# Patient Record
Sex: Female | Born: 1965
Health system: Southern US, Community
[De-identification: ages and names within clinical notes are randomized; demographics above are authoritative.]

## PROBLEM LIST (undated history)

## (undated) DIAGNOSIS — R51 Headache: Secondary | ICD-10-CM

## (undated) HISTORY — DX: Headache: R51

---

## 1998-08-23 ENCOUNTER — Other Ambulatory Visit: Admission: RE | Admit: 1998-08-23 | Discharge: 1998-08-23 | Payer: Self-pay | Admitting: Obstetrics and Gynecology

## 1998-11-23 ENCOUNTER — Inpatient Hospital Stay (HOSPITAL_COMMUNITY): Admission: AD | Admit: 1998-11-23 | Discharge: 1998-11-23 | Payer: Self-pay | Admitting: Obstetrics and Gynecology

## 1999-03-23 ENCOUNTER — Inpatient Hospital Stay (HOSPITAL_COMMUNITY): Admission: AD | Admit: 1999-03-23 | Discharge: 1999-03-25 | Payer: Self-pay | Admitting: Obstetrics and Gynecology

## 1999-05-02 ENCOUNTER — Other Ambulatory Visit: Admission: RE | Admit: 1999-05-02 | Discharge: 1999-05-02 | Payer: Self-pay | Admitting: Obstetrics and Gynecology

## 2000-05-09 ENCOUNTER — Other Ambulatory Visit: Admission: RE | Admit: 2000-05-09 | Discharge: 2000-05-09 | Payer: Self-pay | Admitting: Obstetrics and Gynecology

## 2001-12-26 ENCOUNTER — Other Ambulatory Visit: Admission: RE | Admit: 2001-12-26 | Discharge: 2001-12-26 | Payer: Self-pay | Admitting: Obstetrics and Gynecology

## 2005-05-03 ENCOUNTER — Other Ambulatory Visit: Admission: RE | Admit: 2005-05-03 | Discharge: 2005-05-03 | Payer: Self-pay | Admitting: Obstetrics and Gynecology

## 2011-05-25 ENCOUNTER — Other Ambulatory Visit: Payer: Self-pay | Admitting: Obstetrics and Gynecology

## 2011-05-25 DIAGNOSIS — R928 Other abnormal and inconclusive findings on diagnostic imaging of breast: Secondary | ICD-10-CM

## 2011-06-09 ENCOUNTER — Ambulatory Visit
Admission: RE | Admit: 2011-06-09 | Discharge: 2011-06-09 | Disposition: A | Discharge status: Home / Self Care | Payer: 59 | Source: Ambulatory Visit | Attending: Obstetrics and Gynecology | Admitting: Obstetrics and Gynecology

## 2011-06-09 DIAGNOSIS — R928 Other abnormal and inconclusive findings on diagnostic imaging of breast: Secondary | ICD-10-CM

## 2011-07-24 ENCOUNTER — Encounter: Payer: Self-pay | Admitting: Family Medicine

## 2011-07-24 ENCOUNTER — Ambulatory Visit (INDEPENDENT_AMBULATORY_CARE_PROVIDER_SITE_OTHER): Payer: 59 | Admitting: Family Medicine

## 2011-07-24 VITALS — BP 124/84 | Temp 98.7°F | Ht 68.0 in | Wt 234.0 lb

## 2011-07-24 DIAGNOSIS — Z0000 Encounter for general adult medical examination without abnormal findings: Secondary | ICD-10-CM

## 2011-07-24 NOTE — Patient Instructions (Signed)
When you do your breast exam monthly at home also do a thorough skin exam.  We will see u  yearly for general physical examination and as a part of that.  We will also do a complete skin exam.  I will put in a consult request to see the nutritionist about her diet and weight loss program

## 2011-07-24 NOTE — Progress Notes (Signed)
  Subjective:    Patient ID: Laura Ward, female    DOB: Feb 23, 1966, 45 y.o.   MRN: [id number]  HPI Ramaya is a 45 year old, married female, G84, P31, 6 year old daughter 73 year old son, who comes in today and is a new patient for general physical examination  Sheets always been in excellent, health.  She's had no chronic health problems.  Her children are 15 and 12.  For birth control use condoms.  Pap by GYN.  Normal.She does BSE monthly.  Recent mammogram was normal except for a suspicious lesion left breast.  Follow-up ultrasound normal.  Mother had breast cancer at age 70.  She gets routine eye care, now worse reading glasses, regular dental care,  She had a dysplastic nevus removed from her back 2008.  The rest review of systems negative.  She is up-to-date on all her vaccinations.  Father has a history of high cholesterol.  Mother had breast cancer.  States 32, now atrial fib, but a breast cancer survivor.  One brother two sisters in good health.  Social, history she's married lives in [place].  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  Review of Systems  Constitutional: Negative.   HENT: Negative.   Eyes: Negative.   Respiratory: Negative.   Cardiovascular: Negative.   Gastrointestinal: Negative.   Genitourinary: Negative.   Musculoskeletal: Negative.   Neurological: Negative.   Hematological: Negative.   Psychiatric/Behavioral: Negative.        Objective:   Physical Exam  Constitutional: She appears well-developed and well-nourished.  HENT:  Head: Normocephalic and atraumatic.  Right Ear: External ear normal.  Left Ear: External ear normal.  Nose: Nose normal.  Mouth/Throat: Oropharynx is clear and moist.  Eyes: EOM are normal. Pupils are equal, round, and reactive to light.  Neck: Normal range of motion. Neck supple. No thyromegaly present.  Cardiovascular: Normal rate, regular rhythm, normal heart sounds and intact distal pulses.  Exam  reveals no gallop and no friction rub.   No murmur heard. Pulmonary/Chest: Effort normal and breath sounds normal.  Abdominal: Soft. Bowel sounds are normal. She exhibits no distension and no mass. There is no tenderness. There is no rebound.  Genitourinary:       Bilateral breast exam normal  Musculoskeletal: Normal range of motion.  Lymphadenopathy:    She has no cervical adenopathy.  Neurological: She is alert. She has normal reflexes. No cranial nerve deficit. She exhibits normal muscle tone. Coordination normal.  Skin: Skin is warm and dry.  Psychiatric: She has a normal mood and affect. Her behavior is normal. Judgment and thought content normal.   Total body skin exam shows numerous freckles moles skin tags.  Nothing appears to be pathologic.  There is an inch Scar on her left scapula from previous dysplastic nevi removal       Assessment & Plan:  Healthy female.  Weight 234 pounds.  Discussed diet, exercise and weight loss.  History of dysplastic nevus.  Careful skin exam monthly at home also.  Careful exam here, yearly

## 2011-08-02 ENCOUNTER — Ambulatory Visit: Payer: 59 | Admitting: *Deleted

## 2011-08-18 ENCOUNTER — Ambulatory Visit: Payer: 59 | Admitting: Dietician

## 2012-09-13 ENCOUNTER — Other Ambulatory Visit: Payer: Self-pay | Admitting: Obstetrics and Gynecology

## 2012-09-13 DIAGNOSIS — Z1231 Encounter for screening mammogram for malignant neoplasm of breast: Secondary | ICD-10-CM

## 2012-10-09 ENCOUNTER — Ambulatory Visit
Admission: RE | Admit: 2012-10-09 | Discharge: 2012-10-09 | Disposition: A | Discharge status: Home / Self Care | Payer: 59 | Source: Ambulatory Visit | Attending: Obstetrics and Gynecology | Admitting: Obstetrics and Gynecology

## 2012-10-09 DIAGNOSIS — Z1231 Encounter for screening mammogram for malignant neoplasm of breast: Secondary | ICD-10-CM

## 2012-10-22 ENCOUNTER — Other Ambulatory Visit: Payer: 59

## 2012-10-25 ENCOUNTER — Other Ambulatory Visit (INDEPENDENT_AMBULATORY_CARE_PROVIDER_SITE_OTHER): Payer: Federal, State, Local not specified - PPO

## 2012-10-25 DIAGNOSIS — Z0000 Encounter for general adult medical examination without abnormal findings: Secondary | ICD-10-CM

## 2012-10-25 LAB — POCT URINALYSIS DIPSTICK
Bilirubin, UA: NEGATIVE
Blood, UA: NEGATIVE
Nitrite, UA: NEGATIVE
Spec Grav, UA: 1.01
Urobilinogen, UA: 0.2
pH, UA: 7

## 2012-10-25 LAB — CBC WITH DIFFERENTIAL/PLATELET
Basophils Relative: 0.8 % (ref 0.0–3.0)
Eosinophils Relative: 2.4 % (ref 0.0–5.0)
Hemoglobin: 12.9 g/dL (ref 12.0–15.0)
Lymphocytes Relative: 32 % (ref 12.0–46.0)
Neutro Abs: 1.7 10*3/uL (ref 1.4–7.7)
Neutrophils Relative %: 53.3 % (ref 43.0–77.0)
RBC: 4.37 Mil/uL (ref 3.87–5.11)
WBC: 3.2 10*3/uL — ABNORMAL LOW (ref 4.5–10.5)

## 2012-10-25 LAB — HEPATIC FUNCTION PANEL
ALT: 18 U/L (ref 0–35)
AST: 20 U/L (ref 0–37)
Bilirubin, Direct: 0 mg/dL (ref 0.0–0.3)
Total Protein: 6.8 g/dL (ref 6.0–8.3)

## 2012-10-25 LAB — LIPID PANEL
Cholesterol: 195 mg/dL (ref 0–200)
HDL: 45.4 mg/dL (ref >39.00)
LDL Cholesterol: 139 mg/dL — ABNORMAL HIGH (ref 0–99)
Total CHOL/HDL Ratio: 4
Triglycerides: 51 mg/dL (ref 0.0–149.0)

## 2012-10-25 LAB — BASIC METABOLIC PANEL
CO2: 25 mEq/L (ref 19–32)
Chloride: 105 mEq/L (ref 96–112)
Sodium: 135 mEq/L (ref 135–145)

## 2012-10-29 ENCOUNTER — Encounter: Payer: Self-pay | Admitting: Family Medicine

## 2012-10-29 ENCOUNTER — Ambulatory Visit (INDEPENDENT_AMBULATORY_CARE_PROVIDER_SITE_OTHER): Payer: Federal, State, Local not specified - PPO | Admitting: Family Medicine

## 2012-10-29 VITALS — BP 110/80 | Temp 98.3°F | Ht 67.75 in | Wt 226.0 lb

## 2012-10-29 DIAGNOSIS — Z0000 Encounter for general adult medical examination without abnormal findings: Secondary | ICD-10-CM

## 2012-10-29 DIAGNOSIS — Z803 Family history of malignant neoplasm of breast: Secondary | ICD-10-CM

## 2012-10-29 NOTE — Patient Instructions (Signed)
Continue your good health habits  Begin a diet and exercise program  We will set you up a time to see Maylon Cos for genetic discussion  Do a thorough breast exam monthly at home  Return in one year sooner if any problems  Set up a time to remove the lesion on the left lower leg ASAP

## 2012-10-29 NOTE — Progress Notes (Signed)
  Subjective:    Patient ID: Laura Ward, female    DOB: 08/06/1965, 47 y.o.   MRN: [id number]  HPI Laura Ward is a delightful 47 year old female nonsmoker who comes in today for general medical examination  She states she feels well and has no complaints. Her periods are regular she sees her GYN for yearly Paps  She had a dysplastic lesion removed from her left subscapular reason years ago. We will also do a complete skin exam because it was a dysplastic nevus  She had a recent mammogram which was normal. In reviewing her family history her mother was diagnosed of breast cancer at age 50 maternal grandmother pancreatic cancer maternal grandfather colon cancer. I would recommend she have some genetic evaluation since her mom had breast cancer at such an early age. I will refer her to Maylon Cos geneticist  She gets routine eye care, dental care, does not check her breasts monthly but does get and you mammography.  Laura Ward is a G2 P2,,,,,,,,, 66 year old daughter and 21 year old son   Review of Systems  Constitutional: Negative.   HENT: Negative.   Eyes: Negative.   Respiratory: Negative.   Cardiovascular: Negative.   Gastrointestinal: Negative.   Genitourinary: Negative.   Musculoskeletal: Negative.   Neurological: Negative.   Psychiatric/Behavioral: Negative.        Objective:   Physical Exam  Constitutional: She is oriented to person, place, and time. She appears well-developed and well-nourished.  HENT:  Head: Normocephalic and atraumatic.  Right Ear: External ear normal.  Left Ear: External ear normal.  Nose: Nose normal.  Mouth/Throat: Oropharynx is clear and moist.  Eyes: EOM are normal. Pupils are equal, round, and reactive to light.  Neck: Normal range of motion. Neck supple. No thyromegaly present.  Cardiovascular: Normal rate, regular rhythm, normal heart sounds and intact distal pulses.  Exam reveals no gallop and no friction rub.   No murmur  heard. Pulmonary/Chest: Effort normal and breath sounds normal.  Abdominal: Soft. Bowel sounds are normal. She exhibits no distension and no mass. There is no tenderness. There is no rebound.  Genitourinary:  Bilateral breast exam normal she was taught BSE  Musculoskeletal: Normal range of motion.  Lymphadenopathy:    She has no cervical adenopathy.  Neurological: She is alert and oriented to person, place, and time. She has normal reflexes. No cranial nerve deficit. She exhibits normal muscle tone. Coordination normal.  Skin: Skin is warm and dry.  Total body skin exam normal except for scar left subscapular area from previous dysplastic nevus that was removed.  She does have a lesion left lower calf is red and irritated and bleeds a lot when she shaves. Advised to return for removal  Psychiatric: She has a normal mood and affect. Her behavior is normal. Judgment and thought content normal.          Assessment & Plan:  Healthy female  Positive family history of breast cancer,,,,,,,,,, mother diagnosed at age 47,,,,,,,, I will set up to see Maylon Cos  for genetic evaluation  Slightly overweight encouraged diet exercise and weight loss

## 2012-10-30 ENCOUNTER — Telehealth: Payer: Self-pay | Admitting: Genetic Counselor

## 2012-10-30 NOTE — Telephone Encounter (Signed)
S/W PT IN RE TO GENETIC APPT 04/30 @ 1:30 W/CATHERINE FINE WELCOME PACKET EMAILED.

## 2012-11-08 ENCOUNTER — Other Ambulatory Visit: Payer: Federal, State, Local not specified - PPO | Admitting: Lab

## 2012-11-12 ENCOUNTER — Ambulatory Visit: Payer: Federal, State, Local not specified - PPO | Admitting: Family Medicine

## 2012-11-20 ENCOUNTER — Other Ambulatory Visit: Payer: Federal, State, Local not specified - PPO | Admitting: Lab

## 2012-11-20 ENCOUNTER — Ambulatory Visit (HOSPITAL_BASED_OUTPATIENT_CLINIC_OR_DEPARTMENT_OTHER): Payer: Federal, State, Local not specified - PPO | Admitting: Genetic Counselor

## 2012-11-20 DIAGNOSIS — Z803 Family history of malignant neoplasm of breast: Secondary | ICD-10-CM

## 2012-11-20 DIAGNOSIS — IMO0002 Reserved for concepts with insufficient information to code with codable children: Secondary | ICD-10-CM

## 2012-11-20 DIAGNOSIS — Z800 Family history of malignant neoplasm of digestive organs: Secondary | ICD-10-CM

## 2012-11-20 NOTE — Progress Notes (Signed)
Dr. Tawanna Cooler requested a cancer genetics consultation for Pennye Beeghly, a 47 y.o. female, due to a family history of cancer.  Ms. Biondo presents to clinic today to discuss the possibility of a hereditary predisposition to cancer, genetic testing, and to further clarify her future cancer risks, as well as potential cancer risk for family members.   HISTORY OF PRESENT ILLNESS: Ms. Cuthrell has no personal history of cancer.   Past Medical History  Diagnosis Date   Headache     No past surgical history on file.  HORMONAL RISK FACTORS: Menarche was at age 53 First live birth at age 47  OCP use: approximately 7 years Ovaries intact: yes Hysterectomy: no Menopausal status: premenopausal HRT use: approximately 0 years Colonoscopy: no; not examined UTD mammogram: yes Number of breast biopsies: 0 UTD pelvic exam:  yes Excessive radiation exposure:  no  History   Social History   Marital Status: Single    Spouse Name: N/A    Number of Children: N/A   Years of Education: N/A   Social History Main Topics   Smoking status: Never Smoker    Smokeless tobacco: Not on file   Alcohol Use: No   Drug Use: No   Sexually Active: Not on file   Other Topics Concern   Not on file   Social History Narrative   No narrative on file     FAMILY HISTORY:  During the visit, a 4-generation pedigree was obtained. Significant diagnoses include the following:  Family History  Problem Relation Age of Onset   Breast cancer Mother 67    TAH-BSO in 86s - benign reasons   Pancreatic cancer Maternal Grandmother 73   Colon cancer Maternal Grandfather 36    Ms. Sebastiano's ancestry is of Caucasian descent. There is no known consanguinity.  GENETIC COUNSELING ASSESSMENT: Ms. Kellenberger is a 47 y.o. female with a family history of breast cancer, colon cancer and pancreatic cancer. This history is suggestive of a hereditary predisposition to cancer and we, therefore, discussed and recommended the  following at today's visit.   DISCUSSION: We reviewed the characteristics, features and inheritance patterns of hereditary cancer syndromes. We also discussed genetic testing, including the appropriate family members to test, the process of testing, insurance coverage and turn-around-time for results. Based on Ms. Knapps family history, we recommended her mother, who was diagnosed with breast cancer at age 7, have genetic counseling and testing. We discussed that it is always most informative to initiate genetic testing in a family member diagnosed with cancer, but if this is not possible, we would then test an unaffected family member. Ms. Tally will speak with this family member and let us know if we can be of any assistance in coordinating genetic counseling and/or testing. However, in the meantime, she did wish to pursue genetic testing for herself.   PLAN: Ms. Ryland wished to pursue genetic testing and the blood sample will be sent to [facility] for analysis of the breast and ovarian cancer gene panel.  We discussed the implications of a positive, negative and/ or variant of uncertain significance genetic test result. Results should be available within approximately 8 weeks time, at which point they will be disclosed by telephone to Ms. Schaffer, as will any additional recommendations warranted by these results. Ms. Manalang will receive a summary of her genetic counseling visit and a copy of her results once available. This information will also be available in Epic.     We also encouraged Ms. Lynelle Doctor  to remain in contact with cancer genetics annually so that we can continuously update the family history and inform her of any changes in cancer genetics and testing that may be of benefit for this family. Ms.  Rovira questions were answered to her satisfaction today. Our contact information was provided should additional questions or concerns arise. Thank you for the referral and allowing [place] to share  in the care of your patient.   The patient was seen for a total of 55 minutes, greater than 50% of which was spent face-to-face counseling.  This patient was discussed with Dr. Drue Second and she agrees with the above.   _______________________________________________________________________ For Office Staff:  Number of people involved in session: 2 Was an Intern/ student involved with case: not applicable

## 2012-12-23 ENCOUNTER — Encounter: Payer: Self-pay | Admitting: Genetic Counselor

## 2014-09-17 ENCOUNTER — Other Ambulatory Visit (INDEPENDENT_AMBULATORY_CARE_PROVIDER_SITE_OTHER): Payer: Federal, State, Local not specified - PPO

## 2014-09-17 DIAGNOSIS — Z0000 Encounter for general adult medical examination without abnormal findings: Secondary | ICD-10-CM

## 2014-09-17 LAB — BASIC METABOLIC PANEL
BUN: 12 mg/dL (ref 6–23)
CHLORIDE: 106 meq/L (ref 96–112)
CO2: 26 mEq/L (ref 19–32)
CREATININE: 0.92 mg/dL (ref 0.40–1.20)
Calcium: 8.9 mg/dL (ref 8.4–10.5)
GFR: 69.36 mL/min (ref >60.00)
GLUCOSE: 96 mg/dL (ref 70–99)
Potassium: 4.3 mEq/L (ref 3.5–5.1)
Sodium: 137 mEq/L (ref 135–145)

## 2014-09-17 LAB — LIPID PANEL
Cholesterol: 218 mg/dL — ABNORMAL HIGH (ref 0–200)
HDL: 54.2 mg/dL (ref >39.00)
LDL Cholesterol: 152 mg/dL — ABNORMAL HIGH (ref 0–99)
NonHDL: 163.8
Total CHOL/HDL Ratio: 4
Triglycerides: 60 mg/dL (ref 0.0–149.0)
VLDL: 12 mg/dL (ref 0.0–40.0)

## 2014-09-17 LAB — CBC WITH DIFFERENTIAL/PLATELET
BASOS ABS: 0 10*3/uL (ref 0.0–0.1)
Basophils Relative: 0.9 % (ref 0.0–3.0)
Eosinophils Absolute: 0.1 10*3/uL (ref 0.0–0.7)
Eosinophils Relative: 3.1 % (ref 0.0–5.0)
HEMATOCRIT: 38.4 % (ref 36.0–46.0)
Hemoglobin: 13.3 g/dL (ref 12.0–15.0)
LYMPHS ABS: 0.9 10*3/uL (ref 0.7–4.0)
Lymphocytes Relative: 28.5 % (ref 12.0–46.0)
MCHC: 34.6 g/dL (ref 30.0–36.0)
MCV: 86.5 fl (ref 78.0–100.0)
MONOS PCT: 11.5 % (ref 3.0–12.0)
Monocytes Absolute: 0.4 10*3/uL (ref 0.1–1.0)
NEUTROS ABS: 1.8 10*3/uL (ref 1.4–7.7)
Neutrophils Relative %: 56 % (ref 43.0–77.0)
Platelets: 245 10*3/uL (ref 150.0–400.0)
RBC: 4.44 Mil/uL (ref 3.87–5.11)
RDW: 13 % (ref 11.5–15.5)
WBC: 3.2 10*3/uL — AB (ref 4.0–10.5)

## 2014-09-17 LAB — POCT URINALYSIS DIPSTICK
Bilirubin, UA: NEGATIVE
Glucose, UA: NEGATIVE
Ketones, UA: NEGATIVE
Leukocytes, UA: NEGATIVE
Nitrite, UA: NEGATIVE
PROTEIN UA: NEGATIVE
RBC UA: NEGATIVE
SPEC GRAV UA: 1.015
UROBILINOGEN UA: 0.2
pH, UA: 7

## 2014-09-17 LAB — HEPATIC FUNCTION PANEL
ALT: 14 U/L (ref 0–35)
AST: 16 U/L (ref 0–37)
Albumin: 4 g/dL (ref 3.5–5.2)
Alkaline Phosphatase: 63 U/L (ref 39–117)
BILIRUBIN DIRECT: 0.1 mg/dL (ref 0.0–0.3)
TOTAL PROTEIN: 6.6 g/dL (ref 6.0–8.3)
Total Bilirubin: 0.6 mg/dL (ref 0.2–1.2)

## 2014-09-17 LAB — TSH: TSH: 1.48 u[IU]/mL (ref 0.35–4.50)

## 2014-09-22 ENCOUNTER — Ambulatory Visit (INDEPENDENT_AMBULATORY_CARE_PROVIDER_SITE_OTHER): Payer: Federal, State, Local not specified - PPO | Admitting: Family Medicine

## 2014-09-22 ENCOUNTER — Encounter: Payer: Self-pay | Admitting: Family Medicine

## 2014-09-22 VITALS — BP 120/80 | Temp 98.4°F | Ht 69.0 in | Wt 229.0 lb

## 2014-09-22 DIAGNOSIS — Z0000 Encounter for general adult medical examination without abnormal findings: Secondary | ICD-10-CM

## 2014-09-22 NOTE — Progress Notes (Signed)
   Subjective:    Patient ID: Laura Ward, female    DOB: 10-29-1965, 49 y.o.   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  Her medical issue is been her weight. When she was here last year she dropped about 15-20 pounds and then she gained it back. She's up to 229 at 69 inches tall EP normal 120/80  Does not get routine eye care recommended Dr. Hazle Quantigby, does get regular dental care. Colonoscopy at age 49, she does breast exams sporadically last mammogram was March 2014. Recommend annual mammography and monthly BSE  Family history mother had breast cancer dates 2333. We send Laura Ward to the geneticist last year. Genetic workup showed she was negative for increased risk for breast cancer. Father's retired pediatrician his high cholesterol to sister's one brother all older but all 3 in good health.  Vaccinations up-to-date at the hospital   Review of Systems  Constitutional: Negative.   HENT: Negative.   Eyes: Negative.   Respiratory: Negative.   Cardiovascular: Negative.   Gastrointestinal: Negative.   Endocrine: Negative.   Genitourinary: Negative.   Musculoskeletal: Negative.   Skin: Negative.   Allergic/Immunologic: Negative.   Neurological: Negative.   Hematological: Negative.   Psychiatric/Behavioral: Negative.        Objective:   Physical Exam  Constitutional: She appears well-developed and well-nourished.  HENT:  Head: Normocephalic and atraumatic.  Right Ear: External ear normal.  Left Ear: External ear normal.  Nose: Nose normal.  Mouth/Throat: Oropharynx is clear and moist.  Eyes: EOM are normal. Pupils are equal, round, and reactive to light.  Neck: Normal range of motion. Neck supple. No JVD present. No tracheal  deviation present. No thyromegaly present.  Cardiovascular: Normal rate, regular rhythm, normal heart sounds and intact distal pulses.  Exam reveals no gallop and no friction rub.   No murmur heard. Pulmonary/Chest: Effort normal and breath sounds normal. No stridor. No respiratory distress. She has no wheezes. She has no rales. She exhibits no tenderness.  Abdominal: Soft. Bowel sounds are normal. She exhibits no distension and no mass. There is no tenderness. There is no rebound and no guarding.  Genitourinary:  Bilateral breast exam normal  Musculoskeletal: Normal range of motion.  Lymphadenopathy:    She has no cervical adenopathy.  Neurological: She is alert. She has normal reflexes. No cranial nerve deficit. She exhibits normal muscle tone. Coordination normal.  Skin: Skin is warm and dry. No rash noted. No erythema. No pallor.  Total body skin exam normal scar left subscapular area from previous lesion removed also right upper posterior thoracic area. Both lesions were dysplastic no skin cancers. The rest of her skin Exam was normal  Psychiatric: She has a normal mood and affect. Her behavior is normal. Judgment and thought content normal.  Nursing note and vitals reviewed.         Assessment & Plan:  Healthy female  Overweight,,,,,,,, again encouraged diet exercise and weight loss

## 2014-09-22 NOTE — Progress Notes (Signed)
Pre visit review using our clinic review tool, if applicable. No additional management support is needed unless otherwise documented below in the visit note. 

## 2014-09-22 NOTE — Patient Instructions (Signed)
   Resume your diet and exercise program.........Marland Kitchen. may be start in the [place] on your trip  Return in one year for general physical exam sooner if any problems  BSE monthly  Set up your screening mammogram........ annual in the future  Rachel's extension is 2231...... please give us a dates of all your vaccinations

## 2015-06-10 ENCOUNTER — Ambulatory Visit (INDEPENDENT_AMBULATORY_CARE_PROVIDER_SITE_OTHER): Payer: Federal, State, Local not specified - PPO | Admitting: Family Medicine

## 2015-06-10 ENCOUNTER — Encounter: Payer: Self-pay | Admitting: Family Medicine

## 2015-06-10 VITALS — BP 118/82 | HR 63 | Temp 98.4°F | Ht 69.0 in | Wt 227.3 lb

## 2015-06-10 DIAGNOSIS — L723 Sebaceous cyst: Secondary | ICD-10-CM | POA: Diagnosis not present

## 2015-06-10 MED ORDER — DOXYCYCLINE HYCLATE 100 MG PO TABS: 100.0000 mg | ORAL_TABLET | Freq: Two times a day (BID) | ORAL | Status: DC

## 2015-06-10 NOTE — Progress Notes (Signed)
HPI:  Acute visit for:  "cyst" -has a cyst on chest -has become inflamed several times -has been red and sore the last few days -denies: fevers, malaise, drainage -she reports PCP has offered removal, she would like to remove to prevent recurrence  ROS: See pertinent positives and negatives per HPI.  Past Medical History  Diagnosis Date  . Headache(784.0)     No past surgical history on file.  Family History  Problem Relation Age of Onset  . Breast cancer Mother 71    TAH-BSO in 62s - benign reasons  . Pancreatic cancer Maternal Grandmother 14  . Colon cancer Maternal Grandfather 52    Social History   Social History  . Marital Status: Single    Spouse Name: N/A  . Number of Children: N/A  . Years of Education: N/A   Social History Main Topics  . Smoking status: Never Smoker   . Smokeless tobacco: None  . Alcohol Use: No  . Drug Use: No  . Sexual Activity: Not Asked   Other Topics Concern  . None   Social History Narrative     Current outpatient prescriptions:  .  doxycycline (VIBRA-TABS) 100 MG tablet, Take 1 tablet (100 mg total) by mouth 2 (two) times daily., Disp: 20 tablet, Rfl: 0  EXAM:  Filed Vitals:   06/10/15 0936  BP: 118/82  Pulse: 63  Temp: 98.4 F (36.9 C)    Body mass index is 33.55 kg/(m^2).  GENERAL: vitals reviewed and listed above, alert, oriented, appears well hydrated and in no acute distress  HEENT: atraumatic, conjunttiva clear, no obvious abnormalities on inspection of external nose and ears  NECK: no obvious masses on inspection  SKIN: small inflamed epidermoid cyst L chest with minimal surrounding erythema of the skin  MS: moves all extremities without noticeable abnormality  PSYCH: pleasant and cooperative, no obvious depression or anxiety  ASSESSMENT AND PLAN:  Discussed the following assessment and plan:  Inflamed epidermoid cyst of skin  -inflamed cyst most likely, infected cyst possible but less  likely -discussed tx options and options for removal - removal to prevent recurrence is best done when cyst is not inflamed -discussed i and d, she prefers to avoid if possible -opted for compresses, short course doxy for antiinflammatory effects and in case infected -Patient advised to return or notify a doctor immediately if symptoms worsen or persist or new concerns arise.  Patient Instructions  Epidermal Cyst An epidermal cyst is usually a small, painless lump under the skin. Cysts often occur on the face, neck, stomach, chest, or genitals. The cyst may be filled with a bad smelling paste. Do not pop your cyst. Popping the cyst can cause pain and puffiness (swelling). HOME CARE   Only take medicines as told by your doctor.  Warm compresses several times daily  Take your medicine (antibiotics) as told. Finish it even if you start to feel better. GET HELP RIGHT AWAY IF:  Your cyst is more tender, red, or puffy.  You are not getting better, or you are getting worse.  You have any questions or concerns. MAKE SURE YOU:  Understand these instructions.  Will watch your condition.  Will get help right away if you are not doing well or get worse.   This information is not intended to replace advice given to you by your health care provider. Make sure you discuss any questions you have with your health care provider.   Document Released: 08/17/2004 Document Revised: 01/09/2012 Document  Reviewed: 01/16/2011 Elsevier Interactive Patient Education [address]2016 Elsevier Inc.      [place], [place] R.

## 2015-06-10 NOTE — Patient Instructions (Addendum)
    Epidermal Cyst An epidermal cyst is usually a small, painless lump under the skin. Cysts often occur on the face, neck, stomach, chest, or genitals. The cyst may be filled with a bad smelling paste. Do not pop your cyst. Popping the cyst can cause pain and puffiness (swelling). HOME CARE   Only take medicines as told by your doctor.  Warm compresses several times daily  Take your medicine (antibiotics) as told. Finish it even if you start to feel better. GET HELP RIGHT AWAY IF:  Your cyst is more tender, red, or puffy.  You are not getting better, or you are getting worse.  You have any questions or concerns. MAKE SURE YOU:  Understand these instructions.  Will watch your condition.  Will get help right away if you are not doing well or get worse.   This information is not intended to replace advice given to you by your health care provider. Make sure you discuss any questions you have with your health care provider.   Document Released: 08/17/2004 Document Revised: 01/09/2012 Document Reviewed: 01/16/2011 Elsevier Interactive Patient Education [employer] Elsevier Inc.

## 2015-06-10 NOTE — Progress Notes (Signed)
Pre visit review using our clinic review tool, if applicable. No additional management support is needed unless otherwise documented below in the visit note. 

## 2016-06-19 ENCOUNTER — Encounter: Payer: Self-pay | Admitting: Genetic Counselor

## 2016-06-19 ENCOUNTER — Telehealth: Payer: Self-pay | Admitting: Genetic Counselor

## 2016-06-19 DIAGNOSIS — Z1379 Encounter for other screening for genetic and chromosomal anomalies: Secondary | ICD-10-CM | POA: Insufficient documentation

## 2016-06-19 DIAGNOSIS — Z803 Family history of malignant neoplasm of breast: Secondary | ICD-10-CM | POA: Insufficient documentation

## 2016-06-19 NOTE — Telephone Encounter (Signed)
Ms. Ozbun previously had genetic testing via the 21-gene Breast/Ovarian Cancer Panel through [facility] in April 2014.  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  This VUS has now been updated to a likely benign variant, thus, [facility] is reclassifying this to a negative genetic test result.  Ms. Walla is happy to receive this news.  She knows she is welcome to call or email with any questions.  I have emailed her a copy of this updated test report.

## 2017-02-21 DIAGNOSIS — K080 Exfoliation of teeth due to systemic causes: Secondary | ICD-10-CM | POA: Diagnosis not present

## 2017-09-27 DIAGNOSIS — K080 Exfoliation of teeth due to systemic causes: Secondary | ICD-10-CM | POA: Diagnosis not present

## 2017-10-15 ENCOUNTER — Ambulatory Visit (INDEPENDENT_AMBULATORY_CARE_PROVIDER_SITE_OTHER): Payer: Federal, State, Local not specified - PPO | Admitting: Family Medicine

## 2017-10-15 ENCOUNTER — Encounter: Payer: Self-pay | Admitting: Family Medicine

## 2017-10-15 VITALS — BP 110/80 | HR 60 | Temp 98.2°F | Ht 69.0 in | Wt 227.0 lb

## 2017-10-15 DIAGNOSIS — Z803 Family history of malignant neoplasm of breast: Secondary | ICD-10-CM | POA: Diagnosis not present

## 2017-10-15 DIAGNOSIS — Z8249 Family history of ischemic heart disease and other diseases of the circulatory system: Secondary | ICD-10-CM

## 2017-10-15 DIAGNOSIS — Z0000 Encounter for general adult medical examination without abnormal findings: Secondary | ICD-10-CM | POA: Diagnosis not present

## 2017-10-15 LAB — CBC WITH DIFFERENTIAL/PLATELET
BASOS ABS: 0 10*3/uL (ref 0.0–0.1)
Basophils Relative: 0.9 % (ref 0.0–3.0)
EOS ABS: 0.1 10*3/uL (ref 0.0–0.7)
Eosinophils Relative: 1.4 % (ref 0.0–5.0)
HCT: 37.6 % (ref 36.0–46.0)
HEMOGLOBIN: 12.9 g/dL (ref 12.0–15.0)
LYMPHS ABS: 1.1 10*3/uL (ref 0.7–4.0)
Lymphocytes Relative: 31.2 % (ref 12.0–46.0)
MCHC: 34.2 g/dL (ref 30.0–36.0)
MCV: 85.6 fl (ref 78.0–100.0)
MONO ABS: 0.3 10*3/uL (ref 0.1–1.0)
Monocytes Relative: 9.3 % (ref 3.0–12.0)
NEUTROS PCT: 57.2 % (ref 43.0–77.0)
Neutro Abs: 2.1 10*3/uL (ref 1.4–7.7)
Platelets: 232 10*3/uL (ref 150.0–400.0)
RBC: 4.4 Mil/uL (ref 3.87–5.11)
RDW: 13.5 % (ref 11.5–15.5)
WBC: 3.7 10*3/uL — AB (ref 4.0–10.5)

## 2017-10-15 LAB — BASIC METABOLIC PANEL
BUN: 19 mg/dL (ref 6–23)
CO2: 29 meq/L (ref 19–32)
CREATININE: 0.92 mg/dL (ref 0.40–1.20)
Calcium: 9.4 mg/dL (ref 8.4–10.5)
Chloride: 104 mEq/L (ref 96–112)
GFR: 68.49 mL/min (ref >60.00)
GLUCOSE: 101 mg/dL — AB (ref 70–99)
POTASSIUM: 4.3 meq/L (ref 3.5–5.1)
Sodium: 140 mEq/L (ref 135–145)

## 2017-10-15 LAB — HEPATIC FUNCTION PANEL
ALBUMIN: 4 g/dL (ref 3.5–5.2)
ALK PHOS: 75 U/L (ref 39–117)
ALT: 14 U/L (ref 0–35)
AST: 19 U/L (ref 0–37)
Bilirubin, Direct: 0.1 mg/dL (ref 0.0–0.3)
Total Bilirubin: 0.6 mg/dL (ref 0.2–1.2)
Total Protein: 6.8 g/dL (ref 6.0–8.3)

## 2017-10-15 LAB — POCT URINALYSIS DIPSTICK
Bilirubin, UA: NEGATIVE
Blood, UA: NEGATIVE
GLUCOSE UA: NEGATIVE
Ketones, UA: NEGATIVE
NITRITE UA: NEGATIVE
Odor: NEGATIVE
PH UA: 6 (ref 5.0–8.0)
Protein, UA: NEGATIVE
SPEC GRAV UA: 1.02 (ref 1.010–1.025)
UROBILINOGEN UA: 0.2 U/dL

## 2017-10-15 LAB — LIPID PANEL
Cholesterol: 208 mg/dL — ABNORMAL HIGH (ref 0–200)
HDL: 56.2 mg/dL (ref >39.00)
LDL Cholesterol: 140 mg/dL — ABNORMAL HIGH (ref 0–99)
NONHDL: 151.52
Total CHOL/HDL Ratio: 4
Triglycerides: 58 mg/dL (ref 0.0–149.0)
VLDL: 11.6 mg/dL (ref 0.0–40.0)

## 2017-10-15 LAB — TSH: TSH: 1.5 u[IU]/mL (ref 0.35–4.50)

## 2017-10-15 NOTE — Progress Notes (Signed)
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 She gets regular dental care, BSE monthly, last mammogram 2014. Recommended BSE monthly and annual mammography. Asked her to call the breast center today  She's due for colonoscopy since she is 51. Negative family history of colon cancer polyps.  Last Pap was 2016 by GYN. Her distal cycle has changed. She now only has 2 periods a year. She does have some menopausal symptoms with hot flushes and dry vagina. She declines any treatment at this juncture.  Vaccinations annual flu shot the hospital because she works there. Tetanus booster unknown ......... She will send us a message on my chart with a copy of her vaccinations from the hospital.  She has a sebaceous cyst on anterior chest wall. He continues to grow get red and irritated. Advised we removed that.  Family history dad has a history of chest the and set up an ablation. Mother has history of breast cancer at age 51 and A. Fib recently. We asked Victorino DikeJennifer to go the genetics center which she did had the genetic screening tests and they were negative. Recommend again that she get annual mammography.  14 point review of systems reviewed and otherwise negative except for above  BP 110/80 (BP Location: Left Arm, Patient Position: Sitting, Cuff Size: Large)   Pulse 60   Temp 98.2 F (36.8 C) (Oral)   Ht 5\' 9"  (1.753 m)   Wt 227 lb (103 kg)   BMI 33.52 kg/m  Well-developed well-nourished female no acute distress vital signs stable she's afebrile except her weight continues to be elevated. She's now to 27 range with a BMI 33.60.  Examination of the HEENT were negative neck was supple. Cardiopulmonary exam was normal breast exam was normal there was a golf ball size sebaceous cyst on her anterior chest wall. She  also has a sebaceous cyst on her scalp and one in the midline of her abdomen below the xiphoid. Abdominal exam was negative pelvic done by GYN therefore deferred extremities normal skin normal peripheral pulses normal except for about  #1 healthy female  #2 obesity..........Marland Kitchen. Again discussed diet exercise and weight loss  #3 sebaceous cyst.............Marland Kitchen. Return for removal  #4 family history of breast cancer mom at age 632........ Genetic studies [place] were negative...Marland Kitchen.Marland Kitchen.Marland Kitchen. Recommend BSE monthly...Marland Kitchen.Marland Kitchen.Marland Kitchen. And annual mammography.

## 2017-10-15 NOTE — Patient Instructions (Signed)
Labs today............ I will call you if there is anything abnormal  Work hard in the next 12 months to lose 12 pounds....... Carbohydrate free diet 30 minutes of exercise daily  Return tomorrow to remove the cystic lesion on your anterior chest wall as we discussed  Ida RogueSanderson message on my chart with documentation of your tetanus booster  Call the breast center......... Set up for your annual mammogram. In the future do BSE monthly and annual mammography.  We also set you up a consult in GI with Dr. Myrtie Neitheranis to discuss screening colonoscopy

## 2017-10-16 ENCOUNTER — Ambulatory Visit: Payer: Federal, State, Local not specified - PPO | Admitting: Family Medicine

## 2017-10-16 ENCOUNTER — Encounter: Payer: Self-pay | Admitting: Family Medicine

## 2017-10-16 VITALS — BP 128/78 | HR 54

## 2017-10-16 DIAGNOSIS — N6082 Other benign mammary dysplasias of left breast: Secondary | ICD-10-CM

## 2017-10-16 NOTE — Patient Instructions (Signed)
Remove the Band-Aid in 48 hours  Return when necessary

## 2017-10-16 NOTE — Progress Notes (Signed)
Victorino DikeJennifer is a 52 year old married female nonsmoker who comes in today for removal of a sebaceous cyst on her anterior chest wall  She's had this problem in the past in other parts of her body  This lesions been present for couple months is increasing in size and it's on the upper anterior chest wall left side that process strep rubs  BP 128/78 (BP Location: Left Arm, Patient Position: Sitting, Cuff Size: Large)   Pulse (!) 54   SpO2 96%  Well-developed well-nourished female no acute distress vital signs stable she's afebrile  Physical examination shows a 15 x 15 mm sebaceous type cyst  Procedure  After thorough explanation and informed consent the lesion was cleaned with alcohol and anesthetized with 1% Xylocaine with epinephrine. Incision was made the cyst was removed in toto. Band-Aid was applied  We also removed for skin tags gratis. 100 right breast 3 in her left axillary area.  #1 sebaceous cyst.........Marland Kitchen. Removed as above

## 2017-10-18 ENCOUNTER — Telehealth: Payer: Self-pay

## 2017-10-18 ENCOUNTER — Other Ambulatory Visit: Payer: Self-pay | Admitting: Family Medicine

## 2017-10-18 DIAGNOSIS — R739 Hyperglycemia, unspecified: Secondary | ICD-10-CM

## 2017-10-18 NOTE — Telephone Encounter (Signed)
Labs for Hemoglobin and A1C have been placed

## 2017-11-15 ENCOUNTER — Encounter: Payer: Self-pay | Admitting: Family Medicine

## 2018-02-27 ENCOUNTER — Other Ambulatory Visit: Payer: Self-pay | Admitting: Obstetrics and Gynecology

## 2018-02-27 DIAGNOSIS — Z1231 Encounter for screening mammogram for malignant neoplasm of breast: Secondary | ICD-10-CM

## 2018-03-27 ENCOUNTER — Ambulatory Visit
Admission: RE | Admit: 2018-03-27 | Discharge: 2018-03-27 | Disposition: A | Discharge status: Home / Self Care | Payer: Federal, State, Local not specified - PPO | Source: Ambulatory Visit | Attending: Obstetrics and Gynecology | Admitting: Obstetrics and Gynecology

## 2018-03-27 DIAGNOSIS — Z1231 Encounter for screening mammogram for malignant neoplasm of breast: Secondary | ICD-10-CM

## 2018-04-10 ENCOUNTER — Other Ambulatory Visit: Payer: Federal, State, Local not specified - PPO

## 2018-04-25 DIAGNOSIS — K080 Exfoliation of teeth due to systemic causes: Secondary | ICD-10-CM | POA: Diagnosis not present

## 2018-05-01 ENCOUNTER — Other Ambulatory Visit: Payer: Federal, State, Local not specified - PPO

## 2018-05-08 ENCOUNTER — Other Ambulatory Visit (INDEPENDENT_AMBULATORY_CARE_PROVIDER_SITE_OTHER): Payer: Federal, State, Local not specified - PPO

## 2018-05-08 DIAGNOSIS — R739 Hyperglycemia, unspecified: Secondary | ICD-10-CM | POA: Diagnosis not present

## 2018-05-08 LAB — CBC WITH DIFFERENTIAL/PLATELET
Basophils Absolute: 0 10*3/uL (ref 0.0–0.1)
Basophils Relative: 0.7 % (ref 0.0–3.0)
EOS ABS: 0.1 10*3/uL (ref 0.0–0.7)
EOS PCT: 3.3 % (ref 0.0–5.0)
HCT: 41.6 % (ref 36.0–46.0)
Hemoglobin: 14 g/dL (ref 12.0–15.0)
LYMPHS ABS: 1.4 10*3/uL (ref 0.7–4.0)
Lymphocytes Relative: 38.7 % (ref 12.0–46.0)
MCHC: 33.7 g/dL (ref 30.0–36.0)
MCV: 85.8 fl (ref 78.0–100.0)
MONO ABS: 0.4 10*3/uL (ref 0.1–1.0)
Monocytes Relative: 10.9 % (ref 3.0–12.0)
NEUTROS PCT: 46.4 % (ref 43.0–77.0)
Neutro Abs: 1.6 10*3/uL (ref 1.4–7.7)
Platelets: 223 10*3/uL (ref 150.0–400.0)
RBC: 4.85 Mil/uL (ref 3.87–5.11)
RDW: 13.9 % (ref 11.5–15.5)
WBC: 3.5 10*3/uL — ABNORMAL LOW (ref 4.0–10.5)

## 2018-05-08 LAB — HEMOGLOBIN A1C: HEMOGLOBIN A1C: 5.6 % (ref 4.6–6.5)

## 2019-04-04 ENCOUNTER — Ambulatory Visit (INDEPENDENT_AMBULATORY_CARE_PROVIDER_SITE_OTHER): Payer: Federal, State, Local not specified - PPO | Admitting: Family Medicine

## 2019-04-04 ENCOUNTER — Other Ambulatory Visit: Payer: Self-pay

## 2019-04-04 ENCOUNTER — Encounter: Payer: Self-pay | Admitting: Family Medicine

## 2019-04-04 DIAGNOSIS — Z1211 Encounter for screening for malignant neoplasm of colon: Secondary | ICD-10-CM

## 2019-04-04 DIAGNOSIS — R7301 Impaired fasting glucose: Secondary | ICD-10-CM

## 2019-04-04 DIAGNOSIS — Z803 Family history of malignant neoplasm of breast: Secondary | ICD-10-CM

## 2019-04-04 DIAGNOSIS — D72819 Decreased white blood cell count, unspecified: Secondary | ICD-10-CM

## 2019-04-04 DIAGNOSIS — E785 Hyperlipidemia, unspecified: Secondary | ICD-10-CM

## 2019-04-04 NOTE — Progress Notes (Signed)
Virtual Visit via Video Note  I connected with Laura Ward   on 04/04/19 at 11:00 AM EDT by a video enabled telemedicine application and verified that I am speaking with the correct person using two identifiers.  Location patient: home Location provider:work office Persons participating in the virtual visit: patient, provider  I discussed the limitations of evaluation and management by telemedicine and the availability of in person appointments. The patient expressed understanding and agreed to proceed.   Laura PayorJennifer Ward DOB: 02/25/1966 Encounter date: 04/04/2019  This is a 53 y.o. female who presents to establish care. Formerly patient of Dr. Tawanna Coolerodd. Last visit with him was 09/2017.   No chief complaint on file.    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   No specific concerns today.   Occasionally seasonal allergies: takes allegra prn. Not too bad this year. Just started to take it in last couple of days.   No significant headache issue - just sometimes with large weather changes/allergies.   Follows with Dr. Rana SnareLowe for gyn needs. She is planning to schedule mammogram soon (due this month).   "horrible sweet tooth" Does like walking, running, bike riding. Has some hip issues, but still likes doing these and just works on stretching after. Does this 7 days/week.   Past Medical History:  Diagnosis Date  . Headache(784.0)    History reviewed. No pertinent surgical history. No Known Allergies No outpatient medications have been marked as taking for the 04/04/19 encounter (Office Visit) with Wynn BankerKoberlein, Duy Lemming C, MD.   Social History   Tobacco Use  . Smoking status: Never Smoker  . Smokeless tobacco: Never Used  Substance Use Topics  . Alcohol use: No   Family History  Problem Relation Age of Onset  . Breast cancer Mother 332       TAH-BSO in 7530s - benign reasons  . Atrial fibrillation Mother   .  Hyperlipidemia Mother   . Pancreatic cancer Maternal Grandmother [id number]  . Colon cancer Maternal Grandfather [phone]  . Arthritis Father   . Heart disease Father        SVT  . Heart disease Paternal Grandmother        died during heart surgery  . Heart disease Paternal Grandfather   . Stroke Paternal Grandfather      Review of Systems  Constitutional: Negative for chills, fatigue and fever.  Respiratory: Negative for cough, chest tightness, shortness of breath and wheezing.   Cardiovascular: Negative for chest pain, palpitations and leg swelling.    Objective:  There were no vitals taken for this visit.      BP Readings from Last 3 Encounters:  10/16/17 128/78  10/15/17 110/80  06/10/15 118/82   Wt Readings from Last 3 Encounters:  10/15/17 227 lb (103 kg)  06/10/15 227 lb 4.8 oz (103.1 kg)  09/22/14 229 lb (103.9 kg)    EXAM:  GENERAL: alert, oriented, appears well and in no acute distress  HEENT: atraumatic, conjunctiva clear, no obvious abnormalities on inspection of external nose and ears  NECK: normal movements of the head and neck  LUNGS: on inspection no signs of respiratory distress, breathing rate appears normal, no obvious gross SOB, gasping or wheezing  CV: no obvious cyanosis  MS: moves all visible extremities without noticeable abnormality  PSYCH/NEURO: pleasant and cooperative, no obvious depression or anxiety, speech and thought processing grossly intact  SKIN: no facial or neck abnormalities  noted.   Assessment/Plan 1. Screening for colon cancer - Cologuard  2. Family history of breast cancer in female She will schedule mammogram. Does follow with gynecology.  3. Hyperlipidemia, unspecified hyperlipidemia type - Lipid panel; Future - TSH; Future  4. Elevated fasting glucose - Comprehensive metabolic panel; Future - Hemoglobin A1c; Future  5. Leukopenia, unspecified type - CBC with Differential/Platelet; Future    Return for physical  exam.   I discussed the assessment and treatment plan with the patient. The patient was provided an opportunity to ask questions and all were answered. The patient agreed with the plan and demonstrated an understanding of the instructions.   The patient was advised to call back or seek an in-person evaluation if the symptoms worsen or if the condition fails to improve as anticipated.  I provided 22 minutes of non-face-to-face time during this encounter.   Micheline Rough, MD

## 2019-04-08 ENCOUNTER — Telehealth: Payer: Self-pay | Admitting: *Deleted

## 2019-04-08 NOTE — Telephone Encounter (Signed)
Appt scheduled for 11/18 at 11am.

## 2019-04-08 NOTE — Telephone Encounter (Signed)
-----   Message from Caren Macadam, MD sent at 04/04/2019 11:42 AM EDT ----- Cologuard ordered today. Please set up labwork and physical (can do at same time if she desires)

## 2019-06-11 ENCOUNTER — Ambulatory Visit: Payer: Federal, State, Local not specified - PPO | Admitting: Family Medicine

## 2019-06-23 ENCOUNTER — Other Ambulatory Visit: Payer: Self-pay

## 2019-06-23 ENCOUNTER — Ambulatory Visit (INDEPENDENT_AMBULATORY_CARE_PROVIDER_SITE_OTHER): Payer: Federal, State, Local not specified - PPO | Admitting: Family Medicine

## 2019-06-23 ENCOUNTER — Encounter: Payer: Self-pay | Admitting: Family Medicine

## 2019-06-23 VITALS — BP 102/70 | HR 70 | Temp 97.3°F | Ht 68.0 in | Wt 222.7 lb

## 2019-06-23 DIAGNOSIS — R7301 Impaired fasting glucose: Secondary | ICD-10-CM | POA: Diagnosis not present

## 2019-06-23 DIAGNOSIS — E785 Hyperlipidemia, unspecified: Secondary | ICD-10-CM

## 2019-06-23 DIAGNOSIS — L578 Other skin changes due to chronic exposure to nonionizing radiation: Secondary | ICD-10-CM

## 2019-06-23 DIAGNOSIS — Z0000 Encounter for general adult medical examination without abnormal findings: Secondary | ICD-10-CM

## 2019-06-23 DIAGNOSIS — D72819 Decreased white blood cell count, unspecified: Secondary | ICD-10-CM | POA: Diagnosis not present

## 2019-06-23 DIAGNOSIS — Z1211 Encounter for screening for malignant neoplasm of colon: Secondary | ICD-10-CM

## 2019-06-23 LAB — COMPREHENSIVE METABOLIC PANEL
ALT: 20 U/L (ref 0–35)
AST: 24 U/L (ref 0–37)
Albumin: 4 g/dL (ref 3.5–5.2)
Alkaline Phosphatase: 75 U/L (ref 39–117)
BUN: 13 mg/dL (ref 6–23)
CO2: 28 mEq/L (ref 19–32)
Calcium: 9.4 mg/dL (ref 8.4–10.5)
Chloride: 105 mEq/L (ref 96–112)
Creatinine, Ser: 0.9 mg/dL (ref 0.40–1.20)
GFR: 65.65 mL/min (ref >60.00)
Glucose, Bld: 103 mg/dL — ABNORMAL HIGH (ref 70–99)
Potassium: 4.4 mEq/L (ref 3.5–5.1)
Sodium: 139 mEq/L (ref 135–145)
Total Bilirubin: 0.6 mg/dL (ref 0.2–1.2)
Total Protein: 6.7 g/dL (ref 6.0–8.3)

## 2019-06-23 LAB — CBC WITH DIFFERENTIAL/PLATELET
Basophils Absolute: 0 10*3/uL (ref 0.0–0.1)
Basophils Relative: 0.9 % (ref 0.0–3.0)
Eosinophils Absolute: 0.1 10*3/uL (ref 0.0–0.7)
Eosinophils Relative: 2.1 % (ref 0.0–5.0)
HCT: 40.3 % (ref 36.0–46.0)
Hemoglobin: 13.5 g/dL (ref 12.0–15.0)
Lymphocytes Relative: 27 % (ref 12.0–46.0)
Lymphs Abs: 1.1 10*3/uL (ref 0.7–4.0)
MCHC: 33.5 g/dL (ref 30.0–36.0)
MCV: 86 fl (ref 78.0–100.0)
Monocytes Absolute: 0.3 10*3/uL (ref 0.1–1.0)
Monocytes Relative: 8.3 % (ref 3.0–12.0)
Neutro Abs: 2.5 10*3/uL (ref 1.4–7.7)
Neutrophils Relative %: 61.7 % (ref 43.0–77.0)
Platelets: 218 10*3/uL (ref 150.0–400.0)
RBC: 4.69 Mil/uL (ref 3.87–5.11)
RDW: 14.1 % (ref 11.5–15.5)
WBC: 4 10*3/uL (ref 4.0–10.5)

## 2019-06-23 LAB — LIPID PANEL
Cholesterol: 275 mg/dL — ABNORMAL HIGH (ref 0–200)
HDL: 58.6 mg/dL (ref >39.00)
LDL Cholesterol: 197 mg/dL — ABNORMAL HIGH (ref 0–99)
NonHDL: 216.53
Total CHOL/HDL Ratio: 5
Triglycerides: 98 mg/dL (ref 0.0–149.0)
VLDL: 19.6 mg/dL (ref 0.0–40.0)

## 2019-06-23 LAB — TSH: TSH: 1.7 u[IU]/mL (ref 0.35–4.50)

## 2019-06-23 LAB — HEMOGLOBIN A1C: Hgb A1c MFr Bld: 5.9 % (ref 4.6–6.5)

## 2019-06-23 NOTE — Progress Notes (Signed)
Laura Ward DOB: 12/12/1965 Encounter date: 06/23/2019  This is a 53 y.o. female who presents for complete physical   History of present illness/Additional concerns:  Daughter moved to [place], [place] so she was just there last week.   No physical concerns today.   Sees Dr. Rana Snare for gyn needs. Due for mammogram.   cologuard completed: never got it. We will resend order.  Past Medical History:  Diagnosis Date  . Headache(784.0)    History reviewed. No pertinent surgical history. No Known Allergies Current Meds  Medication Sig  . Fexofenadine HCl (ALLEGRA PO) Take by mouth as needed.   Social History   Tobacco Use  . Smoking status: Never Smoker  . Smokeless tobacco: Never Used  Substance Use Topics  . Alcohol use: No   Family History  Problem Relation Age of Onset  . Breast cancer Mother 70       TAH-BSO in 59s - benign reasons  . Atrial fibrillation Mother   . Hyperlipidemia Mother   . Pancreatic cancer Maternal Grandmother 67  . Colon cancer Maternal Grandfather 33  . Arthritis Father   . Heart disease Father        SVT  . Heart disease Paternal Grandmother        died during heart surgery  . Heart disease Paternal Grandfather   . Stroke Paternal Grandfather      Review of Systems  Constitutional: Negative for activity change, appetite change, chills, fatigue, fever and unexpected weight change.  HENT: Negative for congestion, ear pain, hearing loss, sinus pressure, sinus pain, sore throat and trouble swallowing.   Eyes: Negative for pain and visual disturbance.  Respiratory: Negative for cough, chest tightness, shortness of breath and wheezing.   Cardiovascular: Negative for chest pain, palpitations and leg swelling.  Gastrointestinal: Negative for abdominal pain, blood in stool, constipation, diarrhea, nausea and vomiting.  Genitourinary: Negative for difficulty urinating and menstrual problem.  Musculoskeletal: Negative for arthralgias and back  pain.  Skin: Negative for rash.  Neurological: Negative for dizziness, weakness, numbness and headaches.  Hematological: Negative for adenopathy. Does not bruise/bleed easily.  Psychiatric/Behavioral: Negative for sleep disturbance and suicidal ideas. The patient is not nervous/anxious.     CBC:  Lab Results  Component Value Date   WBC 3.5 (L) 05/08/2018   HGB 14.0 05/08/2018   HCT 41.6 05/08/2018   MCHC 33.7 05/08/2018   RDW 13.9 05/08/2018   PLT 223.0 05/08/2018   CMP: Lab Results  Component Value Date   NA 140 10/15/2017   K 4.3 10/15/2017   CL 104 10/15/2017   CO2 29 10/15/2017   GLUCOSE 101 (H) 10/15/2017   BUN 19 10/15/2017   CREATININE 0.92 10/15/2017   CALCIUM 9.4 10/15/2017   PROT 6.8 10/15/2017   BILITOT 0.6 10/15/2017   ALKPHOS 75 10/15/2017   ALT 14 10/15/2017   AST 19 10/15/2017   LIPID: Lab Results  Component Value Date   CHOL 208 (H) 10/15/2017   TRIG 58.0 10/15/2017   HDL 56.20 10/15/2017   LDLCALC 140 (H) 10/15/2017    Objective:  BP 102/70 (BP Location: Left Arm, Patient Position: Sitting, Cuff Size: Large)   Pulse 70   Temp (!) 97.3 F (36.3 C) (Temporal)   Ht 5\' 8"  (1.727 m)   Wt 222 lb 11.2 oz (101 kg)   LMP 01/21/2017 (Approximate)   SpO2 98%   BMI 33.86 kg/m   Weight: 222 lb 11.2 oz (101 kg)   BP Readings from  Last 3 Encounters:  06/23/19 102/70  10/16/17 128/78  10/15/17 110/80   Wt Readings from Last 3 Encounters:  06/23/19 222 lb 11.2 oz (101 kg)  10/15/17 227 lb (103 kg)  06/10/15 227 lb 4.8 oz (103.1 kg)    Physical Exam Constitutional:      General: She is not in acute distress.    Appearance: She is well-developed.  HENT:     Head: Normocephalic and atraumatic.     Right Ear: External ear normal.     Left Ear: External ear normal.     Mouth/Throat:     Pharynx: No oropharyngeal exudate.  Eyes:     Conjunctiva/sclera: Conjunctivae normal.     Pupils: Pupils are equal, round, and reactive to light.  Neck:      Musculoskeletal: Normal range of motion and neck supple.     Thyroid: No thyromegaly.  Cardiovascular:     Rate and Rhythm: Normal rate and regular rhythm.     Heart sounds: Normal heart sounds. No murmur. No friction rub. No gallop.   Pulmonary:     Effort: Pulmonary effort is normal.     Breath sounds: Normal breath sounds.  Abdominal:     General: Bowel sounds are normal. There is no distension.     Palpations: Abdomen is soft. There is no mass.     Tenderness: There is no abdominal tenderness. There is no guarding.     Hernia: No hernia is present.  Musculoskeletal: Normal range of motion.        General: No tenderness or deformity.  Lymphadenopathy:     Cervical: No cervical adenopathy.  Skin:    General: Skin is warm and dry.     Findings: No rash.     Comments: Left anterior chest there is a 1 and half centimeter cyst/scar.  Not currently tender or inflamed.  Neurological:     Mental Status: She is alert and oriented to person, place, and time.     Deep Tendon Reflexes: Reflexes normal.     Reflex Scores:      Tricep reflexes are 2+ on the right side and 2+ on the left side.      Bicep reflexes are 2+ on the right side and 2+ on the left side.      Brachioradialis reflexes are 2+ on the right side and 2+ on the left side.      Patellar reflexes are 2+ on the right side and 2+ on the left side. Psychiatric:        Speech: Speech normal.        Behavior: Behavior normal.        Thought Content: Thought content normal.     Assessment/Plan: Health Maintenance Due  Topic Date Due  . COLONOSCOPY  02/05/2017  . PAP SMEAR-Modifier  07/25/2019   Health Maintenance reviewed.  1. Preventative health care Keep up with exercise. Yoga info given. Labwork today. Follow up with gyn.  - MM 3D SCREEN BREAST BILATERAL; Future  2. Sun-damaged skin/cysts Also suggested benzoyl peroxide wash to help keep down cysts/boils. Suggested waxing over shaving for bikini line - Ambulatory  referral to Dermatology  3. Hyperlipidemia, unspecified hyperlipidemia type Lab work to be completed today - TSH - Lipid panel  4. Elevated fasting glucose - Hemoglobin A1c - Comprehensive metabolic panel  5. Leukopenia, unspecified type - CBC with Differential/Platelet   Return if symptoms worsen or fail to improve, for and pending bloodwork.  Micheline Rough, MD

## 2019-06-23 NOTE — Patient Instructions (Signed)
[email or web address] [place])

## 2019-06-25 NOTE — Addendum Note (Signed)
Addended by: Beckie Busing on: 06/25/2019 02:58 PM   Modules accepted: Orders

## 2019-06-27 ENCOUNTER — Encounter: Payer: Self-pay | Admitting: Family Medicine

## 2019-06-27 DIAGNOSIS — E785 Hyperlipidemia, unspecified: Secondary | ICD-10-CM

## 2019-06-27 MED ORDER — ATORVASTATIN CALCIUM 10 MG PO TABS: 10.0000 mg | ORAL_TABLET | Freq: Every day | ORAL | 2 refills | Status: DC

## 2019-07-19 ENCOUNTER — Other Ambulatory Visit: Payer: Self-pay | Admitting: Family Medicine

## 2019-07-19 DIAGNOSIS — E785 Hyperlipidemia, unspecified: Secondary | ICD-10-CM

## 2019-08-14 ENCOUNTER — Other Ambulatory Visit: Payer: Self-pay

## 2019-08-14 ENCOUNTER — Ambulatory Visit
Admission: RE | Admit: 2019-08-14 | Discharge: 2019-08-14 | Disposition: A | Discharge status: Home / Self Care | Payer: Federal, State, Local not specified - PPO | Source: Ambulatory Visit | Attending: Family Medicine | Admitting: Family Medicine

## 2019-08-14 DIAGNOSIS — Z0000 Encounter for general adult medical examination without abnormal findings: Secondary | ICD-10-CM

## 2019-08-14 DIAGNOSIS — Z1231 Encounter for screening mammogram for malignant neoplasm of breast: Secondary | ICD-10-CM | POA: Diagnosis not present

## 2019-08-14 LAB — HM MAMMOGRAPHY

## 2019-08-15 ENCOUNTER — Encounter: Payer: Self-pay | Admitting: *Deleted

## 2019-08-15 ENCOUNTER — Telehealth: Payer: Self-pay | Admitting: *Deleted

## 2019-08-15 NOTE — Telephone Encounter (Signed)
-----   Message from Wynn Banker, MD sent at 08/02/2019 12:20 PM EST ----- Was cologuard received? I don't see results; just checking in. Please resend order if not received.  ----- Message ----- From: SYSTEM Sent: 04/09/2019  12:05 AM EST To: Wynn Banker, MD

## 2019-08-15 NOTE — Telephone Encounter (Signed)
Mychart message sent to the pt asking to complete the kit or call the office if she has not received this.

## 2019-10-25 ENCOUNTER — Other Ambulatory Visit: Payer: Self-pay | Admitting: Family Medicine

## 2019-10-25 DIAGNOSIS — E785 Hyperlipidemia, unspecified: Secondary | ICD-10-CM

## 2020-01-12 ENCOUNTER — Ambulatory Visit
Admission: RE | Admit: 2020-01-12 | Discharge: 2020-01-12 | Disposition: A | Discharge status: Home / Self Care | Payer: Federal, State, Local not specified - PPO | Source: Ambulatory Visit | Attending: Emergency Medicine | Admitting: Emergency Medicine

## 2020-01-12 ENCOUNTER — Other Ambulatory Visit: Payer: Self-pay

## 2020-01-12 VITALS — BP 106/64 | HR 60 | Temp 98.6°F | Resp 16

## 2020-01-12 DIAGNOSIS — L259 Unspecified contact dermatitis, unspecified cause: Secondary | ICD-10-CM | POA: Diagnosis not present

## 2020-01-12 MED ORDER — PREDNISONE 10 MG (21) PO TBPK: ORAL_TABLET | Freq: Every day | ORAL | 0 refills | Status: DC

## 2020-01-12 NOTE — ED Triage Notes (Signed)
Pt c/o rash on bilat hands onset Thursday, pt states she has been using oral  benadryl and a cortisone cream. Pt states she just returned from the beach and was in the sun a lot.

## 2020-01-12 NOTE — Discharge Instructions (Signed)
Take the prednisone as directed.  Follow up with your primary care provider if your symptoms are not improving.    

## 2020-01-12 NOTE — ED Provider Notes (Signed)
[name]    CSN: [id number] Arrival date & time: 01/12/20  1305      History   Chief Complaint Chief Complaint  Patient presents with  . Rash    HPI Laura Ward is a 54 y.o. female.   Patient presents with rash and swelling on her hands x5 days.  No difficulty swallowing or breathing.  She states it started on her right index finger and has spread to her other fingers and other hand.  She states it is mildly tender but does not itch.  She has been treating it at home with oral Benadryl and hydrocortisone cream.  She denies new foods, medications, products.  She states she just returned from the beach and has a history of reaction to the sun.  She denies numbness, weakness, paresthesias, fever, chills, sore throat, cough, shortness of breath, vomiting, diarrhea, or other symptoms.    The history is provided by the patient.    Past Medical History:  Diagnosis Date  . [id number](253.6)     Patient Active Problem List   Diagnosis Date Noted  . Sebaceous cyst of skin of left breast 10/16/2017  . Genetic testing 06/19/2016  . Family history of breast cancer in female 06/19/2016    History reviewed. No pertinent surgical history.  OB History    Gravida  2   Para  2   Term      Preterm      AB      Living        SAB      TAB      Ectopic      Multiple      Live Births               Home Medications    Prior to Admission medications   Medication Sig Start Date End Date Taking? Authorizing Provider  atorvastatin (LIPITOR) 10 MG tablet TAKE 1 TABLET BY MOUTH EVERY DAY 10/27/19  Yes Koberlein, Junell C, MD  Fexofenadine HCl (ALLEGRA PO) Take by mouth as needed.   Yes [provider]  predniSONE (STERAPRED UNI-PAK 21 TAB) 10 MG (21) TBPK tablet Take by mouth daily. Take 6 tabs by mouth daily  for 1 day, then 5 tabs for 1 day, then 4 tabs for 1 day, then 3 tabs for 1 day, 2 tabs for 1 day, then 1 tab by mouth daily for 1 day 01/12/20    Sharion Balloon, NP    Family History Family History  Problem Relation Age of Onset  . Breast cancer Mother 28       TAH-BSO in 61s - benign reasons  . Atrial fibrillation Mother   . Hyperlipidemia Mother   . Pancreatic cancer Maternal Grandmother 36  . Colon cancer Maternal Grandfather 55  . Arthritis Father   . Heart disease Father        SVT  . Heart disease Paternal Grandmother        died during heart surgery  . Heart disease Paternal Grandfather   . Stroke Paternal Grandfather     Social History Social History   Tobacco Use  . Smoking status: Never Smoker  . Smokeless tobacco: Never Used  Substance Use Topics  . Alcohol use: No  . Drug use: No     Allergies   Patient has no known allergies.   Review of Systems Review of Systems  Constitutional: Negative for chills and fever.  HENT: Negative for ear pain and  sore throat.   Eyes: Negative for pain and visual disturbance.  Respiratory: Negative for cough and shortness of breath.   Cardiovascular: Negative for chest pain and palpitations.  Gastrointestinal: Negative for abdominal pain and vomiting.  Genitourinary: Negative for dysuria and hematuria.  Musculoskeletal: Negative for arthralgias and back pain.  Skin: Positive for rash. Negative for color change.  Neurological: Negative for seizures, syncope, weakness and numbness.  All other systems reviewed and are negative.    Physical Exam Triage Vital Signs ED Triage Vitals  Enc Vitals Group     BP      Pulse      Resp      Temp      Temp src      SpO2      Weight      Height      Head Circumference      Peak Flow      Pain Score      Pain Loc      Pain Edu?      Excl. in GC?    No data found.  Updated Vital Signs BP 106/64 (BP Location: Left Arm)   Pulse 60   Temp 98.6 F (37 C) (Oral)   Resp 16   SpO2 100%   Visual Acuity Right Eye Distance:   Left Eye Distance:   Bilateral Distance:    Right Eye Near:   Left Eye Near:      Bilateral Near:     Physical Exam Vitals and nursing note reviewed.  Constitutional:      General: She is not in acute distress.    Appearance: She is well-developed.  HENT:     Head: Normocephalic and atraumatic.     Mouth/Throat:     Mouth: Mucous membranes are moist.     Pharynx: Oropharynx is clear.     Comments: Speech normal. No difficulty swallowing.  Eyes:     Conjunctiva/sclera: Conjunctivae normal.  Cardiovascular:     Rate and Rhythm: Normal rate and regular rhythm.     Heart sounds: No murmur heard.   Pulmonary:     Effort: Pulmonary effort is normal. No respiratory distress.     Breath sounds: Normal breath sounds.  Abdominal:     Palpations: Abdomen is soft.     Tenderness: There is no abdominal tenderness.  Musculoskeletal:        General: Swelling present.     Cervical back: Neck supple.  Skin:    General: Skin is warm and dry.     Capillary Refill: Capillary refill takes less than 2 seconds.     Findings: Rash present. No erythema.  Neurological:     General: No focal deficit present.     Mental Status: She is alert and oriented to person, place, and time.     Sensory: No sensory deficit.     Motor: No weakness.  Psychiatric:        Mood and Affect: Mood normal.        Behavior: Behavior normal.          UC Treatments / Results  Labs (all labs ordered are listed, but only abnormal results are displayed) Labs Reviewed - No data to display  EKG   Radiology No results found.  Procedures Procedures (including critical care time)  Medications Ordered in UC Medications - No data to display  Initial Impression / Assessment and Plan / UC Course  I have reviewed the triage vital signs  and the nursing notes.  Pertinent labs & imaging results that were available during my care of the patient were reviewed by me and considered in my medical decision making (see chart for details).   Contact dermatitis.  Treating with prednisone.  Instructed  patient to continue Benadryl.  Instructed her to return here if she notes increased redness, swelling, pain, or other concerns.  Instructed her to follow-up with her PCP if her symptoms are not improving.  Patient agrees to plan of care.     Final Clinical Impressions(s) / UC Diagnoses   Final diagnoses:  Contact dermatitis, unspecified contact dermatitis type, unspecified trigger     Discharge Instructions     Take the prednisone as directed.    Follow up with your primary care provider if your symptoms are not improving.       ED Prescriptions    Medication Sig Dispense Auth. Provider   predniSONE (STERAPRED UNI-PAK 21 TAB) 10 MG (21) TBPK tablet Take by mouth daily. Take 6 tabs by mouth daily  for 1 day, then 5 tabs for 1 day, then 4 tabs for 1 day, then 3 tabs for 1 day, 2 tabs for 1 day, then 1 tab by mouth daily for 1 day 21 tablet Mickie Bail, NP     PDMP not reviewed this encounter.   Mickie Bail, NP 01/12/20 1401

## 2020-03-30 ENCOUNTER — Ambulatory Visit: Payer: Federal, State, Local not specified - PPO | Attending: Internal Medicine

## 2020-03-30 DIAGNOSIS — Z23 Encounter for immunization: Secondary | ICD-10-CM

## 2020-03-30 NOTE — Progress Notes (Signed)
   Covid-19 Vaccination Clinic  Name:  Kymberly Blomberg    MRN: [id number] DOB: Jul 19, 1966  03/30/2020  Ms. Benedict was observed post Covid-19 immunization for 15 minutes without incident. She was provided with Vaccine Information Sheet and instruction to access the V-Safe system.   Ms. Renwick was instructed to call 911 with any severe reactions post vaccine: Marland Kitchen Difficulty breathing  . Swelling of face and throat  . A fast heartbeat  . A bad rash all over body  . Dizziness and weakness

## 2020-04-08 ENCOUNTER — Other Ambulatory Visit: Payer: Self-pay | Admitting: Family Medicine

## 2020-04-08 DIAGNOSIS — E785 Hyperlipidemia, unspecified: Secondary | ICD-10-CM

## 2020-04-09 MED ORDER — ATORVASTATIN CALCIUM 10 MG PO TABS: 10.0000 mg | ORAL_TABLET | Freq: Every day | ORAL | 0 refills | Status: DC

## 2020-04-27 ENCOUNTER — Ambulatory Visit: Payer: Self-pay

## 2020-11-01 ENCOUNTER — Encounter: Payer: Self-pay | Admitting: Family Medicine

## 2021-05-19 ENCOUNTER — Emergency Department (HOSPITAL_BASED_OUTPATIENT_CLINIC_OR_DEPARTMENT_OTHER): Payer: Federal, State, Local not specified - PPO

## 2021-05-19 ENCOUNTER — Encounter (HOSPITAL_BASED_OUTPATIENT_CLINIC_OR_DEPARTMENT_OTHER): Payer: Self-pay

## 2021-05-19 ENCOUNTER — Emergency Department (HOSPITAL_BASED_OUTPATIENT_CLINIC_OR_DEPARTMENT_OTHER)
Admission: EM | Admit: 2021-05-19 | Discharge: 2021-05-19 | Disposition: A | Discharge status: Home / Self Care | Payer: Federal, State, Local not specified - PPO | Attending: Emergency Medicine | Admitting: Emergency Medicine

## 2021-05-19 ENCOUNTER — Other Ambulatory Visit: Payer: Self-pay

## 2021-05-19 DIAGNOSIS — R0789 Other chest pain: Secondary | ICD-10-CM | POA: Diagnosis not present

## 2021-05-19 DIAGNOSIS — R159 Full incontinence of feces: Secondary | ICD-10-CM | POA: Diagnosis not present

## 2021-05-19 DIAGNOSIS — R519 Headache, unspecified: Secondary | ICD-10-CM | POA: Diagnosis not present

## 2021-05-19 DIAGNOSIS — Z20822 Contact with and (suspected) exposure to covid-19: Secondary | ICD-10-CM | POA: Diagnosis not present

## 2021-05-19 DIAGNOSIS — J9811 Atelectasis: Secondary | ICD-10-CM | POA: Diagnosis not present

## 2021-05-19 DIAGNOSIS — R531 Weakness: Secondary | ICD-10-CM | POA: Diagnosis not present

## 2021-05-19 DIAGNOSIS — R079 Chest pain, unspecified: Secondary | ICD-10-CM | POA: Diagnosis not present

## 2021-05-19 DIAGNOSIS — R55 Syncope and collapse: Secondary | ICD-10-CM | POA: Diagnosis not present

## 2021-05-19 DIAGNOSIS — D72819 Decreased white blood cell count, unspecified: Secondary | ICD-10-CM | POA: Insufficient documentation

## 2021-05-19 DIAGNOSIS — R42 Dizziness and giddiness: Secondary | ICD-10-CM | POA: Diagnosis not present

## 2021-05-19 LAB — CBC
HCT: 43.4 % (ref 36.0–46.0)
Hemoglobin: 14.3 g/dL (ref 12.0–15.0)
MCH: 29.1 pg (ref 26.0–34.0)
MCHC: 32.9 g/dL (ref 30.0–36.0)
MCV: 88.4 fL (ref 80.0–100.0)
Platelets: 172 10*3/uL (ref 150–400)
RBC: 4.91 MIL/uL (ref 3.87–5.11)
RDW: 12.9 % (ref 11.5–15.5)
WBC: 3.1 10*3/uL — ABNORMAL LOW (ref 4.0–10.5)
nRBC: 0 % (ref 0.0–0.2)

## 2021-05-19 LAB — URINALYSIS, ROUTINE W REFLEX MICROSCOPIC
Bilirubin Urine: NEGATIVE
Glucose, UA: NEGATIVE mg/dL
Hgb urine dipstick: NEGATIVE
Leukocytes,Ua: NEGATIVE
Nitrite: NEGATIVE
Specific Gravity, Urine: 1.024 (ref 1.005–1.030)
pH: 5.5 (ref 5.0–8.0)

## 2021-05-19 LAB — PREGNANCY, URINE: Preg Test, Ur: NEGATIVE

## 2021-05-19 LAB — RESP PANEL BY RT-PCR (FLU A&B, COVID) ARPGX2
Influenza A by PCR: NEGATIVE
Influenza B by PCR: NEGATIVE
SARS Coronavirus 2 by RT PCR: NEGATIVE

## 2021-05-19 LAB — BASIC METABOLIC PANEL
Anion gap: 9 (ref 5–15)
BUN: 17 mg/dL (ref 6–20)
CO2: 26 mmol/L (ref 22–32)
Calcium: 9.6 mg/dL (ref 8.9–10.3)
Chloride: 102 mmol/L (ref 98–111)
Creatinine, Ser: 0.92 mg/dL (ref 0.44–1.00)
GFR, Estimated: >60 mL/min (ref >60)
Glucose, Bld: 93 mg/dL (ref 70–99)
Potassium: 3.7 mmol/L (ref 3.5–5.1)
Sodium: 137 mmol/L (ref 135–145)

## 2021-05-19 LAB — TROPONIN I (HIGH SENSITIVITY)
Troponin I (High Sensitivity): <2 ng/L (ref <18)
Troponin I (High Sensitivity): 2 ng/L (ref <18)

## 2021-05-19 LAB — D-DIMER, QUANTITATIVE: D-Dimer, Quant: 0.87 ug/mL-FEU — ABNORMAL HIGH (ref 0.00–0.50)

## 2021-05-19 MED ORDER — SODIUM CHLORIDE 0.9 % IV BOLUS (SEPSIS)
1000.0000 mL | Freq: Once | INTRAVENOUS | Status: AC
  Administered 2021-05-19: 1000 mL via INTRAVENOUS

## 2021-05-19 MED ORDER — SODIUM CHLORIDE 0.9 % IV SOLN: 1000.0000 mL | INTRAVENOUS | Status: DC

## 2021-05-19 MED ORDER — ASPIRIN 81 MG PO CHEW
324.0000 mg | CHEWABLE_TABLET | Freq: Once | ORAL | Status: AC
  Administered 2021-05-19: 324 mg via ORAL
  Filled 2021-05-19: qty 4

## 2021-05-19 MED ORDER — IOHEXOL 350 MG/ML SOLN
75.0000 mL | Freq: Once | INTRAVENOUS | Status: AC | PRN
  Administered 2021-05-19: 75 mL via INTRAVENOUS

## 2021-05-19 NOTE — ED Triage Notes (Addendum)
Pt reports waking yesterday feeling weak, nausea and "off feeling". Reports she woke up at 0130 to go to the bathroom when she became dizzy, she could "feel herself falling but everything a blur". She remembers standing then being on the floor on her back and incontinent of stool. Pt reports she yelled for her spouse, he came in and felt she was incoherent and pale. Pt alert and oriented x4. No neuro deficits noted, no hx seizure   Pt reports she flew to [facility] Wednesday and returned Monday night   Pt denies hitting her head

## 2021-05-19 NOTE — ED Provider Notes (Signed)
[facility] EMERGENCY DEPT Provider Note   CSN: [id number] Arrival date & time: 05/19/21  5035     History Chief Complaint  Patient presents with   Dizziness   Weakness    Laura Ward is a 55 y.o. female.   Dizziness Associated symptoms: headaches and weakness   Weakness Associated symptoms: dizziness and headaches   Associated symptoms: no fever    Patient states last evening she started to feel somewhat nauseated and weak.  She just did not feel right.  She ended up going to bed and about 1:30 in the morning she got up to go to the bathroom.  Patient felt lightheaded and dizzy as if she was going to pass out next thing she knows she was on the floor.  She had an episode of fecal incontinence.  Patient was able to get herself off of the floor and go to the bathroom.  Her husband checked on her and he noted that she was incoherent and pale.  Patient ended up getting back into bed and called her doctor this morning.  They recommended she come to the emergency room for evaluation.  Patient did have a recent trip to [place] this past week.  She has not been feeling short of breath while she has had some chest discomfort this morning.  Its not severe.  She is not having any leg swelling.  No known fevers or chills  Past Medical History:  Diagnosis Date   Headache(784.0)     Patient Active Problem List   Diagnosis Date Noted   Sebaceous cyst of skin of left breast 10/16/2017   Genetic testing 06/19/2016   Family history of breast cancer in female 06/19/2016    History reviewed. No pertinent surgical history.   OB History     Gravida  2   Para  2   Term  0   Preterm  0   AB  0   Living         SAB  0   IAB  0   Ectopic  0   Multiple      Live Births              Family History  Problem Relation Age of Onset   Breast cancer Mother 49       TAH-BSO in 28s - benign reasons   Atrial fibrillation Mother    Hyperlipidemia  Mother    Pancreatic cancer Maternal Grandmother 64   Colon cancer Maternal Grandfather 89   Arthritis Father    Heart disease Father        SVT   Heart disease Paternal Grandmother        died during heart surgery   Heart disease Paternal Grandfather    Stroke Paternal Grandfather     Social History   Tobacco Use   Smoking status: Never   Smokeless tobacco: Never  Substance Use Topics   Alcohol use: No   Drug use: No    Home Medications Prior to Admission medications   Medication Sig Start Date End Date Taking? Authorizing Provider  atorvastatin (LIPITOR) 10 MG tablet Take 1 tablet (10 mg total) by mouth daily. 04/09/20   Wynn Banker, MD  Fexofenadine HCl (ALLEGRA PO) Take by mouth as needed.    [provider]  predniSONE (STERAPRED UNI-PAK 21 TAB) 10 MG (21) TBPK tablet Take by mouth daily. Take 6 tabs by mouth daily  for 1 day, then 5 tabs for  1 day, then 4 tabs for 1 day, then 3 tabs for 1 day, 2 tabs for 1 day, then 1 tab by mouth daily for 1 day 01/12/20   Mickie Bail, NP    Allergies    Patient has no known allergies.  Review of Systems   Review of Systems  Constitutional:  Negative for fever.  Neurological:  Positive for dizziness, weakness and headaches.  All other systems reviewed and are negative.  Physical Exam Updated Vital Signs BP 107/65   Pulse (!) 59   Temp 98.9 F (37.2 C) (Oral)   Resp 17   Ht 1.727 m (5\' 8" )   Wt 79.8 kg   SpO2 100%   BMI 26.76 kg/m   Physical Exam Vitals and nursing note reviewed.  Constitutional:      General: She is not in acute distress.    Appearance: She is well-developed.  HENT:     Head: Normocephalic and atraumatic.     Right Ear: External ear normal.     Left Ear: External ear normal.  Eyes:     General: No scleral icterus.       Right eye: No discharge.        Left eye: No discharge.     Conjunctiva/sclera: Conjunctivae normal.  Neck:     Trachea: No tracheal deviation.   Cardiovascular:     Rate and Rhythm: Normal rate and regular rhythm.  Pulmonary:     Effort: Pulmonary effort is normal. No respiratory distress.     Breath sounds: Normal breath sounds. No stridor. No wheezing or rales.  Abdominal:     General: Bowel sounds are normal. There is no distension.     Palpations: Abdomen is soft.     Tenderness: There is no abdominal tenderness. There is no guarding or rebound.  Musculoskeletal:        General: No tenderness.     Cervical back: Neck supple.  Skin:    General: Skin is warm and dry.     Findings: No rash.  Neurological:     Mental Status: She is alert and oriented to person, place, and time.     Cranial Nerves: No cranial nerve deficit (No facial droop, extraocular movements intact, tongue midline ).     Sensory: No sensory deficit.     Motor: No abnormal muscle tone or seizure activity.     Coordination: Coordination normal.     Comments: No pronator drift bilateral upper extrem, able to hold both legs off bed for 5 seconds, sensation intact in all extremities, no visual field cuts, no left or right sided neglect, normal finger-nose exam bilaterally, no nystagmus noted     ED Results / Procedures / Treatments   Labs (all labs ordered are listed, but only abnormal results are displayed) Labs Reviewed  CBC - Abnormal; Notable for the following components:      Result Value   WBC 3.1 (*)    All other components within normal limits  URINALYSIS, ROUTINE W REFLEX MICROSCOPIC - Abnormal; Notable for the following components:   Ketones, ur TRACE (*)    Protein, ur TRACE (*)    All other components within normal limits  D-DIMER, QUANTITATIVE - Abnormal; Notable for the following components:   D-Dimer, Quant 0.87 (*)    All other components within normal limits  RESP PANEL BY RT-PCR (FLU A&B, COVID) [id number]  BASIC METABOLIC PANEL  PREGNANCY, URINE  CBG MONITORING, ED  CBG MONITORING, ED  TROPONIN I (HIGH SENSITIVITY)  TROPONIN I  (HIGH SENSITIVITY)    EKG EKG Interpretation  Date/Time:  Thursday May 19 2021 10:27:20 EDT Ventricular Rate:  82 PR Interval:  138 QRS Duration: 90 QT Interval:  385 QTC Calculation: 450 R Axis:   67 Text Interpretation: Sinus rhythm Biatrial enlargement Borderline T wave abnormalities No old tracing to compare Confirmed by Linwood Dibbles [phone]) on 05/19/2021 10:50:52 AM  Radiology CT Head Wo Contrast  Result Date: 05/19/2021 CLINICAL DATA:  Headaches EXAM: CT HEAD WITHOUT CONTRAST TECHNIQUE: Contiguous axial images were obtained from the base of the skull through the vertex without intravenous contrast. COMPARISON:  None. FINDINGS: Brain: There are no signs of recent bleeding. There is no focal mass effect. Ventricles are not dilated. There is no shift of midline structures. Vascular: Unremarkable. Skull: No abnormality is seen in bony calvarium. There is 15 mm smooth marginated structure with possible calcification in the posterior right parietal scalp. This may suggest granuloma. Please correlate with clinical findings. Sinuses/Orbits: Unremarkable Other: None IMPRESSION: No acute intracranial findings are seen in non-contrast CT brain. Reading location: [place], [place]. Electronically Signed   By: Ernie Avena M.D.   On: 05/19/2021 11:46   CT Angio Chest PE W and/or Wo Contrast  Result Date: 05/19/2021 CLINICAL DATA:  A 55 year old female presents with nausea and weakness and syncope. EXAM: CT ANGIOGRAPHY CHEST WITH CONTRAST TECHNIQUE: Multidetector CT imaging of the chest was performed using the standard protocol during bolus administration of intravenous contrast. Multiplanar CT image reconstructions and MIPs were obtained to evaluate the vascular anatomy. CONTRAST:  9mL OMNIPAQUE IOHEXOL 350 MG/ML SOLN COMPARISON:  Chest x-ray of May 19, 2021. FINDINGS: Cardiovascular: Central pulmonary vessels are well opacified at 347 Hounsfield units density. No signs of pulmonary  embolism. Heart size top normal without pericardial effusion. Aortic caliber is normal. Mediastinum/Nodes: No thoracic inlet, axillary, mediastinal or hilar adenopathy. Esophagus grossly normal. Lungs/Pleura: No effusion. No consolidative process. Minimal basilar atelectasis. No pneumothorax. Upper Abdomen: Imaged portions the liver, gallbladder, pancreas, spleen, adrenal glands and kidneys are unremarkable. Gastrointestinal tract with limited visualization, no acute process. Musculoskeletal: No chest wall abnormality. No acute or significant osseous findings. Review of the MIP images confirms the above findings. IMPRESSION: No signs of pulmonary embolism. No acute cardiopulmonary findings. Minimal basilar atelectasis. Electronically Signed   By: Donzetta Kohut M.D.   On: 05/19/2021 14:03   DG Chest Portable 1 View  Result Date: 05/19/2021 CLINICAL DATA:  Chest pain. EXAM: PORTABLE CHEST 1 VIEW COMPARISON:  None. FINDINGS: The heart size and mediastinal contours are within normal limits. Both lungs are clear. No visible pleural effusions or pneumothorax. No acute osseous abnormality. IMPRESSION: No evidence of acute cardiopulmonary disease. Electronically Signed   By: Feliberto Harts M.D.   On: 05/19/2021 11:43    Procedures Procedures   Medications Ordered in ED Medications  sodium chloride 0.9 % bolus 1,000 mL (1,000 mLs Intravenous New Bag/Given 05/19/21 1236)    Followed by  0.9 %  sodium chloride infusion (has no administration in time range)  aspirin chewable tablet 324 mg (324 mg Oral Given 05/19/21 1232)  iohexol (OMNIPAQUE) 350 MG/ML injection 75 mL (75 mLs Intravenous Contrast Given 05/19/21 1344)    ED Course  I have reviewed the triage vital signs and the nursing notes.  Pertinent labs & imaging results that were available during my care of the patient were reviewed by me and considered in my medical decision making (see chart for details).  Clinical Course as of 05/19/21 1515   Thu May 19, 2021  1210 CT without acute findings [JK]  1210 Chest x-ray without acute findings [JK]  1232 CBC shows white blood cell count decreased at 3.1.  Urinalysis does not suggest infection [JK]  1232 CT without acute findings.  Chest x-ray negative [JK]  1447 CT scan without acute findings [JK]  1508 Second troponin is normal [JK]    Clinical Course User Index [JK] Linwood Dibbles, MD   MDM Rules/Calculators/A&P                           Patient presented to ED for evaluation of a syncopal episode.  Patient did have an episode and incontinence.  No reported seizure activity.  No postictal state per the husband and patient.  Doubt seizure and suspect more syncopal episode.  With her recent travel concerned about the possibility of pulmonary embolism however CT scan does not show any acute findings.  She was having headache so CT scan of the head was performed there is no evidence of hemorrhage.  Labs notable for slight leukopenia but doubt this is clinically significant.  Patient has remained stable in the ED.  No recurrent symptoms.  No cardiac dysrhythmia noted.  At this point appears stable for discharge and recommend outpatient follow-up with PCP.  Could consider outpatient cardiac monitoring.  Warning signs precautions discussed. Final Clinical Impression(s) / ED Diagnoses Final diagnoses:  Syncope, unspecified syncope type    Rx / DC Orders ED Discharge Orders     None        Linwood Dibbles, MD 05/19/21 1515

## 2021-05-19 NOTE — ED Notes (Signed)
Patient transported to CT 

## 2021-05-19 NOTE — ED Notes (Signed)
Dc instructions reviewed with patient. Patient states understanding.

## 2021-05-19 NOTE — Discharge Instructions (Addendum)
The test results in the ED were reassuring.  No signs of blood clot in your legs.  No abnormal findings noted on the CT scan of your brain. Follow-up with your primary care doctor as we discussed.   Return to the ED for recurrent worsening symptoms

## 2021-05-31 ENCOUNTER — Other Ambulatory Visit: Payer: Self-pay | Admitting: Family Medicine

## 2021-05-31 ENCOUNTER — Encounter: Payer: Self-pay | Admitting: Family Medicine

## 2021-05-31 DIAGNOSIS — Z1231 Encounter for screening mammogram for malignant neoplasm of breast: Secondary | ICD-10-CM

## 2021-06-30 ENCOUNTER — Ambulatory Visit
Admission: RE | Admit: 2021-06-30 | Discharge: 2021-06-30 | Disposition: A | Discharge status: Home / Self Care | Payer: Federal, State, Local not specified - PPO | Source: Ambulatory Visit

## 2021-06-30 ENCOUNTER — Other Ambulatory Visit: Payer: Self-pay

## 2021-06-30 DIAGNOSIS — Z1231 Encounter for screening mammogram for malignant neoplasm of breast: Secondary | ICD-10-CM

## 2021-08-05 ENCOUNTER — Encounter: Payer: Self-pay | Admitting: Family Medicine

## 2021-08-05 ENCOUNTER — Ambulatory Visit (INDEPENDENT_AMBULATORY_CARE_PROVIDER_SITE_OTHER): Payer: Federal, State, Local not specified - PPO | Admitting: Family Medicine

## 2021-08-05 VITALS — BP 110/58 | HR 56 | Temp 98.2°F | Ht 67.75 in | Wt 171.4 lb

## 2021-08-05 DIAGNOSIS — Z8249 Family history of ischemic heart disease and other diseases of the circulatory system: Secondary | ICD-10-CM | POA: Diagnosis not present

## 2021-08-05 DIAGNOSIS — Z1211 Encounter for screening for malignant neoplasm of colon: Secondary | ICD-10-CM

## 2021-08-05 DIAGNOSIS — E785 Hyperlipidemia, unspecified: Secondary | ICD-10-CM

## 2021-08-05 DIAGNOSIS — Z23 Encounter for immunization: Secondary | ICD-10-CM | POA: Diagnosis not present

## 2021-08-05 DIAGNOSIS — Z0000 Encounter for general adult medical examination without abnormal findings: Secondary | ICD-10-CM | POA: Diagnosis not present

## 2021-08-05 LAB — CBC WITH DIFFERENTIAL/PLATELET
Basophils Absolute: 0 10*3/uL (ref 0.0–0.1)
Basophils Relative: 1.2 % (ref 0.0–3.0)
Eosinophils Absolute: 0.1 10*3/uL (ref 0.0–0.7)
Eosinophils Relative: 2.4 % (ref 0.0–5.0)
HCT: 41.7 % (ref 36.0–46.0)
Hemoglobin: 13.8 g/dL (ref 12.0–15.0)
Lymphocytes Relative: 35.7 % (ref 12.0–46.0)
Lymphs Abs: 1.1 10*3/uL (ref 0.7–4.0)
MCHC: 33.1 g/dL (ref 30.0–36.0)
MCV: 88.8 fl (ref 78.0–100.0)
Monocytes Absolute: 0.4 10*3/uL (ref 0.1–1.0)
Monocytes Relative: 11.6 % (ref 3.0–12.0)
Neutro Abs: 1.5 10*3/uL (ref 1.4–7.7)
Neutrophils Relative %: 49.1 % (ref 43.0–77.0)
Platelets: 211 10*3/uL (ref 150.0–400.0)
RBC: 4.7 Mil/uL (ref 3.87–5.11)
RDW: 13.4 % (ref 11.5–15.5)
WBC: 3 10*3/uL — ABNORMAL LOW (ref 4.0–10.5)

## 2021-08-05 LAB — COMPREHENSIVE METABOLIC PANEL
ALT: 18 U/L (ref 0–35)
AST: 21 U/L (ref 0–37)
Albumin: 4.5 g/dL (ref 3.5–5.2)
Alkaline Phosphatase: 66 U/L (ref 39–117)
BUN: 14 mg/dL (ref 6–23)
CO2: 29 mEq/L (ref 19–32)
Calcium: 9.7 mg/dL (ref 8.4–10.5)
Chloride: 103 mEq/L (ref 96–112)
Creatinine, Ser: 0.93 mg/dL (ref 0.40–1.20)
GFR: 69.69 mL/min (ref >60.00)
Glucose, Bld: 92 mg/dL (ref 70–99)
Potassium: 4.4 mEq/L (ref 3.5–5.1)
Sodium: 139 mEq/L (ref 135–145)
Total Bilirubin: 0.7 mg/dL (ref 0.2–1.2)
Total Protein: 7.3 g/dL (ref 6.0–8.3)

## 2021-08-05 MED ORDER — SHINGRIX 50 MCG/0.5ML IM SUSR: 0.5000 mL | Freq: Once | INTRAMUSCULAR | 0 refills | Status: AC

## 2021-08-05 NOTE — Progress Notes (Signed)
Brent BullaJennifer Robinson Deupree DOB: August 06, 1965 Encounter date: 08/05/2021  This is a 56 y.o. female who presents for complete physical   History of present illness/Additional concerns: Last visit with me was 05/2019.  Had ER visit in October which was unusual. In middle of night (hadn't had dinner, but was well hydrated) and felt very dizzy, nauseated. Then as walking back to bed felt self going down. Then woke up on ground on back. Then got self up on bed. Had incontinence.  Felt very stomach bug like after that for 24 hours - diarrhea, unwell. Husband had symptoms as well - also started with neurological sx.   Has been working on weight loss. Has lost 60lbs. Still does optivia, but adds in some extra calories to help her with energy to maintain exercise. Eating smaller more regularly through day. Making sure more balanced/protein. 1200-1500 cal/day. She would still like to get to "normal" BMI of 25. She is lifting weight now. Feels that she would be happy with weight 165.   Stopped taking lipitor - gave her heart burn. Would like to have this rechecked. Interested in getting cardiac calcium scoring.    She is due for gyn visit.  Didn't do colon screening.  Mammogram completed 06/2021.     Past Medical History:  Diagnosis Date   Headache(784.0)    History reviewed. No pertinent surgical history. No Known Allergies Current Meds  Medication Sig   Fexofenadine HCl (ALLEGRA PO) Take by mouth as needed.   Zoster Vaccine Adjuvanted [facility](SHINGRIX) injection Inject 0.5 mLs into the muscle once for 1 dose. Repeat in 2-6 months   Social History   Tobacco Use   Smoking status: Never   Smokeless tobacco: Never  Substance Use Topics   Alcohol use: No   Family History  Problem Relation Age of Onset   Breast cancer Mother 6032       TAH-BSO in 2730s - benign reasons   Atrial fibrillation Mother    Hyperlipidemia Mother    Pancreatic cancer Maternal Grandmother 1975   Colon cancer Maternal Grandfather  [id number]   Arthritis Father    Heart disease Father        SVT   Heart disease Paternal Grandmother        died during heart surgery   Heart disease Paternal Grandfather    Stroke Paternal Grandfather      Review of Systems  Constitutional:  Negative for activity change, appetite change, chills, fatigue, fever and unexpected weight change.  HENT:  Negative for congestion, ear pain, hearing loss, sinus pressure, sinus pain, sore throat and trouble swallowing.   Eyes:  Negative for pain and visual disturbance.  Respiratory:  Negative for cough, chest tightness, shortness of breath and wheezing.   Cardiovascular:  Negative for chest pain, palpitations and leg swelling.  Gastrointestinal:  Negative for abdominal pain, blood in stool, constipation, diarrhea, nausea and vomiting.  Genitourinary:  Negative for difficulty urinating and menstrual problem.  Musculoskeletal:  Negative for arthralgias and back pain.  Skin:  Negative for rash.  Neurological:  Negative for dizziness, weakness, numbness and headaches.  Hematological:  Negative for adenopathy. Does not bruise/bleed easily.  Psychiatric/Behavioral:  Negative for sleep disturbance and suicidal ideas. The patient is not nervous/anxious.    CBC:  Lab Results  Component Value Date   WBC 3.1 (L) 05/19/2021   HGB 14.3 05/19/2021   HCT 43.4 05/19/2021   MCH 29.1 05/19/2021   MCHC 32.9 05/19/2021   RDW 12.9 05/19/2021  PLT 172 05/19/2021   CMP: Lab Results  Component Value Date   NA 137 05/19/2021   K 3.7 05/19/2021   CL 102 05/19/2021   CO2 26 05/19/2021   ANIONGAP 9 05/19/2021   GLUCOSE 93 05/19/2021   BUN 17 05/19/2021   CREATININE 0.92 05/19/2021   CALCIUM 9.6 05/19/2021   PROT 6.7 06/23/2019   BILITOT 0.6 06/23/2019   ALKPHOS 75 06/23/2019   ALT 20 06/23/2019   AST 24 06/23/2019   LIPID: Lab Results  Component Value Date   CHOL 275 (H) 06/23/2019   TRIG 98.0 06/23/2019   HDL 58.60 06/23/2019   LDLCALC 197 (H)  06/23/2019    Objective:  BP (!) 110/58 (BP Location: Left Arm, Patient Position: Sitting, Cuff Size: Large)    Pulse (!) 56    Temp 98.2 F (36.8 C) (Oral)    Ht 5' 7.75" (1.721 m)    Wt 171 lb 6.4 oz (77.7 kg)    SpO2 98%    BMI 26.25 kg/m   Weight: 171 lb 6.4 oz (77.7 kg)   BP Readings from Last 3 Encounters:  08/05/21 (!) 110/58  05/19/21 107/65  01/12/20 106/64   Wt Readings from Last 3 Encounters:  08/05/21 171 lb 6.4 oz (77.7 kg)  05/19/21 176 lb (79.8 kg)  06/23/19 222 lb 11.2 oz (101 kg)    Physical Exam Constitutional:      General: She is not in acute distress.    Appearance: She is well-developed.  HENT:     Head: Normocephalic and atraumatic.     Right Ear: External ear normal.     Left Ear: External ear normal.     Mouth/Throat:     Pharynx: No oropharyngeal exudate.  Eyes:     Conjunctiva/sclera: Conjunctivae normal.     Pupils: Pupils are equal, round, and reactive to light.  Neck:     Thyroid: No thyromegaly.  Cardiovascular:     Rate and Rhythm: Normal rate and regular rhythm.     Heart sounds: Normal heart sounds. No murmur heard.   No friction rub. No gallop.  Pulmonary:     Effort: Pulmonary effort is normal.     Breath sounds: Normal breath sounds.  Abdominal:     General: Bowel sounds are normal. There is no distension.     Palpations: Abdomen is soft. There is no mass.     Tenderness: There is no abdominal tenderness. There is no guarding.     Hernia: No hernia is present.  Musculoskeletal:        General: No tenderness or deformity. Normal range of motion.     Cervical back: Normal range of motion and neck supple.  Lymphadenopathy:     Cervical: No cervical adenopathy.  Skin:    General: Skin is warm and dry.     Findings: No rash.     Comments: Multiple moles, but otherwise normal skin exam  Neurological:     Mental Status: She is alert and oriented to person, place, and time.     Deep Tendon Reflexes: Reflexes normal.     Reflex  Scores:      Tricep reflexes are 2+ on the right side and 2+ on the left side.      Bicep reflexes are 2+ on the right side and 2+ on the left side.      Brachioradialis reflexes are 2+ on the right side and 2+ on the left side.  Patellar reflexes are 2+ on the right side and 2+ on the left side. Psychiatric:        Speech: Speech normal.        Behavior: Behavior normal.        Thought Content: Thought content normal.    Assessment/Plan: Health Maintenance Due  Topic Date Due   COLONOSCOPY (Pts 45-65yrs Insurance coverage will need to be confirmed)  Never done   COVID-19 Vaccine (5 - Booster for [place] series) 06/17/2021   Health Maintenance reviewed.  1. Preventative health care Keep up with regular exercise, healthy eating.  - Tdap vaccine greater than or equal to 7yo IM - Zoster Vaccine Adjuvanted [facility]) injection; Inject 0.5 mLs into the muscle once for 1 dose. Repeat in 2-6 months  Dispense: 0.5 mL; Refill: 0 - Ambulatory referral to Dermatology  2. Hyperlipidemia, unspecified hyperlipidemia type She is not taking medication, but willing pending lab/imaging results.  - NMR, lipoprofile - CT CARDIAC SCORING (SELF PAY ONLY); Future - CBC with Differential/Platelet; Future - Comprehensive metabolic panel; Future  3. Family history of heart disease - CT CARDIAC SCORING (SELF PAY ONLY); Future  4. Colon cancer screening - Ambulatory referral to Gastroenterology  Return in about 1 year (around 08/05/2022) for physical exam.  Micheline Rough, MD

## 2021-08-06 LAB — NMR, LIPOPROFILE
Cholesterol, Total: 285 mg/dL — ABNORMAL HIGH (ref 100–199)
HDL Particle Number: 34.4 umol/L (ref >=30.5)
HDL-C: 81 mg/dL (ref >39)
LDL Particle Number: 2155 nmol/L — ABNORMAL HIGH (ref <1000)
LDL Size: 21.4 nm (ref >20.5)
LDL-C (NIH Calc): 197 mg/dL — ABNORMAL HIGH (ref 0–99)
LP-IR Score: <25 (ref <=45)
Small LDL Particle Number: 537 nmol/L — ABNORMAL HIGH (ref <=527)
Triglycerides: 51 mg/dL (ref 0–149)

## 2021-09-12 ENCOUNTER — Ambulatory Visit
Admission: RE | Admit: 2021-09-12 | Discharge: 2021-09-12 | Disposition: A | Discharge status: Home / Self Care | Payer: Self-pay | Source: Ambulatory Visit | Attending: Family Medicine | Admitting: Family Medicine

## 2021-09-12 ENCOUNTER — Other Ambulatory Visit: Payer: Self-pay

## 2021-09-12 ENCOUNTER — Encounter: Payer: Self-pay | Admitting: Family Medicine

## 2021-09-12 DIAGNOSIS — E785 Hyperlipidemia, unspecified: Secondary | ICD-10-CM

## 2021-09-12 DIAGNOSIS — Z8249 Family history of ischemic heart disease and other diseases of the circulatory system: Secondary | ICD-10-CM

## 2021-09-15 ENCOUNTER — Other Ambulatory Visit: Payer: Self-pay

## 2021-09-15 ENCOUNTER — Ambulatory Visit: Payer: Federal, State, Local not specified - PPO | Admitting: Internal Medicine

## 2021-09-15 ENCOUNTER — Telehealth: Payer: Self-pay | Admitting: Internal Medicine

## 2021-09-15 ENCOUNTER — Encounter: Payer: Self-pay | Admitting: Internal Medicine

## 2021-09-15 VITALS — BP 110/72 | HR 57 | Ht 68.0 in | Wt 172.4 lb

## 2021-09-15 DIAGNOSIS — E785 Hyperlipidemia, unspecified: Secondary | ICD-10-CM

## 2021-09-15 DIAGNOSIS — Z1329 Encounter for screening for other suspected endocrine disorder: Secondary | ICD-10-CM

## 2021-09-15 DIAGNOSIS — Z1321 Encounter for screening for nutritional disorder: Secondary | ICD-10-CM

## 2021-09-15 MED ORDER — ROSUVASTATIN CALCIUM 20 MG PO TABS: 20.0000 mg | ORAL_TABLET | Freq: Every day | ORAL | 3 refills | Status: DC

## 2021-09-15 NOTE — Progress Notes (Signed)
LIPID CLINIC CONSULT NOTE  Chief Complaint:  Dyslipidemia, elevated calcium score  Primary Care Physician: Wynn Banker, MD  Primary Cardiologist:  None  HPI:  Laura Ward is a 56 y.o. female who is being seen today for the evaluation of dyslipidemia. 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  This showed 3 areas of punctate plaque in the proximal to mid and distal RCA with a calcium score of 16.5, 85th percentile for age and sex matched control.  Additionally, she had recent lipid testing which was a lipoprotein particle study.  Her LDL-P was 2155 with an LDL-C of 197, HDL-C81, triglycerides 51 and total cholesterol 285.  Small LDL particle numbers were minimally elevated at 537 and LDL size was larger at 21.4 .  In the past she had been on Lipitor but had potentially some side effects from the medication.  Recently she had worked on weight loss and more healthy lifestyle and actually had lost weight and exercises regularly, but despite this her cholesterol numbers have gone up.  She does not take any medications.  She reports no family history of heart disease in either of her parents although high cholesterol had been noted.  She eats a fairly low-fat diet and recently was on a weight loss dietary plan.  He does not participate in the ketogenic or high fat diet.  PMHx:  Past Medical History:  Diagnosis Date   Headache(784.0)     No past surgical history on file.  FAMHx:  Family History  Problem Relation Age of Onset   Breast cancer Mother 39       TAH-BSO in 63s - benign reasons   Atrial fibrillation Mother    Hyperlipidemia Mother    Pancreatic cancer Maternal Grandmother 40   Colon cancer Maternal Grandfather 5   Arthritis Father    Heart disease Father        SVT   Heart disease Paternal Grandmother        died during heart surgery   Heart  disease Paternal Grandfather    Stroke Paternal Grandfather     SOCHx:   reports that she has never smoked. She has never used smokeless tobacco. She reports that she does not drink alcohol and does not use drugs.  ALLERGIES:  No Known Allergies  ROS: Pertinent items noted in HPI and remainder of comprehensive ROS otherwise negative.  HOME MEDS: Current Outpatient Medications on File Prior to Visit  Medication Sig Dispense Refill   Fexofenadine HCl (ALLEGRA PO) Take by mouth as needed.     No current facility-administered medications on file prior to visit.    LABS/IMAGING: No results found for this or any previous visit (from the past 48 hour(s)). No results found.  LIPID PANEL:    Component Value Date/Time   CHOL 275 (H) 06/23/2019 0951   TRIG 98.0 06/23/2019 0951   HDL 58.60 06/23/2019 0951   CHOLHDL 5 06/23/2019 0951   VLDL 19.6 06/23/2019 0951   LDLCALC 197 (H) 06/23/2019 0951    WEIGHTS: Wt Readings from Last 3 Encounters:  09/15/21 172 lb 6.4 oz (78.2 kg)  08/05/21 171 lb 6.4 oz (77.7 kg)  05/19/21 176 lb (79.8 kg)    VITALS: BP 110/72    Pulse (!) 57    Ht 5\' 8"  (1.727 m)    Wt 172 lb 6.4 oz (78.2 kg)    LMP 01/21/2017 (  Approximate)    SpO2 99%    BMI 26.21 kg/m   EXAM: General appearance: alert and no distress Neck: no carotid bruit, no JVD, and thyroid not enlarged, symmetric, no tenderness/mass/nodules Lungs: clear to auscultation bilaterally Heart: regular rate and rhythm, S1, S2 normal, no murmur, click, rub or gallop Abdomen: soft, non-tender; bowel sounds normal; no masses,  no organomegaly Extremities: extremities normal, atraumatic, no cyanosis or edema Pulses: 2+ and symmetric Skin: Skin color, texture, turgor normal. No rashes or lesions Neurologic: Grossly normal Psych: Flat  EKG: Deferred  ASSESSMENT: Possible familial hyperlipidemia, LDL greater than 190 Abnormal CAC score 16.5, 85th percentile for age and sex matched control  (08/2021) Family history of elevated cholesterol in 1 or both parents  PLAN: 1.   [name] has an LDL greater than 190 and has had high cholesterol in the past but was intolerant of atorvastatin.  Recently she was noted to have a high calcium score for her age although limited plaque in the right coronary artery.  This could represent a familial hyperlipidemia as she has been on a low-fat diet and had recent weight loss as well as regular exercise.  We must also consider extrinsic causes of elevations in her cholesterol as well. Will check TSH, Vit D and LP(a) as well today. Based on guidelines, would recommend high intensity statin therapy - start rosuvastatin 20 mg daily. Repeat lipid NMR in 3 months. Will also check a [facility] genetic test today as well.  Finally, I have noted that she has a persistently low white blood cell count of around 3000-3500, dating back over 8 years ago.  Etiology is unclear but may bear further evaluation or at least discussion with hematology.  Her other cell lines are normal.  This could represent nutritional deficiency or perhaps an undiagnosed viremia.  Medication effects are unlikely since she is not on medicine.  Will defer follow-up to PCP.  Follow-up with me in 3-4 months after repeat lipids.  Chrystie Nose, MD, [facility], FACP  [place]   [facility]  Medical Director of the Advanced Lipid Disorders &  Cardiovascular Risk Reduction Clinic Diplomate of the American Board of Clinical Lipidology Attending Cardiologist  Direct Dial: [phone]   Fax: [phone]  Website:  [email or web address] Nicely Laura Ward 09/15/2021, 12:04 PM

## 2021-09-15 NOTE — Patient Instructions (Signed)
Medication Instructions:  START crestor 20mg  daily   *If you need a refill on your cardiac medications before your next appointment, please call your pharmacy*   Lab Work: Vit D, LP(a), TSH today   FASTING NMR lipoprofile about 1 week before your next appointment (in 3-4 months)  If you have labs (blood work) drawn today and your tests are completely normal, you will receive your results only by: [place] (if you have MyChart) OR A paper copy in the mail If you have any lab test that is abnormal or we need to change your treatment, we will call you to review the results.   Testing/Procedures: Genetic Test - dyslipidemia/ASCVD panel Results will be available in about 3 weeks   Follow-Up: At [facility], you and your health needs are our priority.  As part of our continuing mission to provide you with exceptional heart care, we have created designated Provider Care Teams.  These Care Teams include your primary Cardiologist (physician) and Advanced Practice Providers (APPs -  Physician Assistants and Nurse Practitioners) who all work together to provide you with the care you need, when you need it.  We recommend signing up for the patient portal called "MyChart".  Sign up information is provided on this After Visit Summary.  MyChart is used to connect with patients for Virtual Visits (Telemedicine).  Patients are able to view lab/test results, encounter notes, upcoming appointments, etc.  Non-urgent messages can be sent to your provider as well.   To learn more about what you can do with MyChart, go to [email or web address].    Your next appointment:    3-4 months with Dr Debara Pickett  (DOD day is OK)

## 2021-09-15 NOTE — Telephone Encounter (Signed)
Genetic test for dyslipidemia/ASCVD ordered ([facility]) Cheek swab completed in office Specimen and necessary paperwork mailed. ID: [id number]

## 2021-09-16 LAB — LIPOPROTEIN A (LPA): Lipoprotein (a): 81 nmol/L — ABNORMAL HIGH (ref <75.0)

## 2021-09-16 LAB — TSH: TSH: 1.57 u[IU]/mL (ref 0.450–4.500)

## 2021-09-16 LAB — VITAMIN D 25 HYDROXY (VIT D DEFICIENCY, FRACTURES): Vit D, 25-Hydroxy: 50.2 ng/mL (ref 30.0–100.0)

## 2021-09-16 LAB — B12 AND FOLATE PANEL
Folate: 19 ng/mL (ref >3.0)
Vitamin B-12: 416 pg/mL (ref 232–1245)

## 2021-11-21 ENCOUNTER — Ambulatory Visit (INDEPENDENT_AMBULATORY_CARE_PROVIDER_SITE_OTHER): Payer: Federal, State, Local not specified - PPO | Admitting: *Deleted

## 2021-11-21 DIAGNOSIS — Z23 Encounter for immunization: Secondary | ICD-10-CM | POA: Diagnosis not present

## 2021-12-06 ENCOUNTER — Other Ambulatory Visit: Payer: Self-pay | Admitting: Internal Medicine

## 2021-12-06 DIAGNOSIS — E785 Hyperlipidemia, unspecified: Secondary | ICD-10-CM

## 2021-12-12 ENCOUNTER — Ambulatory Visit: Payer: Federal, State, Local not specified - PPO | Admitting: Internal Medicine

## 2021-12-12 ENCOUNTER — Encounter: Payer: Self-pay | Admitting: Internal Medicine

## 2021-12-12 VITALS — BP 120/75 | HR 57 | Ht 68.0 in | Wt 180.8 lb

## 2021-12-12 DIAGNOSIS — I2510 Atherosclerotic heart disease of native coronary artery without angina pectoris: Secondary | ICD-10-CM

## 2021-12-12 DIAGNOSIS — E7841 Elevated Lipoprotein(a): Secondary | ICD-10-CM | POA: Diagnosis not present

## 2021-12-12 DIAGNOSIS — I2584 Coronary atherosclerosis due to calcified coronary lesion: Secondary | ICD-10-CM

## 2021-12-12 DIAGNOSIS — E7849 Other hyperlipidemia: Secondary | ICD-10-CM | POA: Diagnosis not present

## 2021-12-12 DIAGNOSIS — E785 Hyperlipidemia, unspecified: Secondary | ICD-10-CM | POA: Diagnosis not present

## 2021-12-12 NOTE — Progress Notes (Addendum)
LIPID CLINIC CONSULT NOTE  Chief Complaint:  Follow-up dyslipidemia  Primary Care Physician: Caren Macadam, MD  Primary Cardiologist:  None  HPI:  Laura Ward is a 56 y.o. female who is being seen today for the evaluation of dyslipidemia. 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  This showed 3 areas of punctate plaque in the proximal to mid and distal RCA with a calcium score of 16.5, 85th percentile for age and sex matched control.  Additionally, she had recent lipid testing which was a lipoprotein particle study.  Her LDL-P was 2155 with an LDL-C of 197, HDL-C81, triglycerides 51 and total cholesterol 285.  Small LDL particle numbers were minimally elevated at 537 and LDL size was larger at 21.4 .  In the past she had been on Lipitor but had potentially some side effects from the medication.  Recently she had worked on weight loss and more healthy lifestyle and actually had lost weight and exercises regularly, but despite this her cholesterol numbers have gone up.  She does not take any medications.  She reports no family history of heart disease in either of her parents although high cholesterol had been noted.  She eats a fairly low-fat diet and recently was on a weight loss dietary plan.  He does not participate in the ketogenic or high fat diet.  12/12/2021  [name] returns today for follow-up. We discussed the results of her genetic testing which did not show a classic FH mutation however variants of unknown significance which certainly increase cardiovascular risk.  She was also noted to have an elevated LP(a) although minimally.  She seems to been tolerating her rosuvastatin with marked improvement in her lipids.  PMHx:  Past Medical History:  Diagnosis Date   Headache(784.0)     No past surgical history on file.  FAMHx:  Family History  Problem Relation  Age of Onset   Breast cancer Mother 17       TAH-BSO in 58s - benign reasons   Atrial fibrillation Mother    Hyperlipidemia Mother    Pancreatic cancer Maternal Grandmother 5   Colon cancer Maternal Grandfather 44   Arthritis Father    Heart disease Father        SVT   Heart disease Paternal Grandmother        died during heart surgery   Heart disease Paternal Grandfather    Stroke Paternal Grandfather     SOCHx:   reports that she has never smoked. She has never used smokeless tobacco. She reports that she does not drink alcohol and does not use drugs.  ALLERGIES:  No Known Allergies  ROS: Pertinent items noted in HPI and remainder of comprehensive ROS otherwise negative.  HOME MEDS: Current Outpatient Medications on File Prior to Visit  Medication Sig Dispense Refill   Fexofenadine HCl (ALLEGRA PO) Take by mouth as needed.     rosuvastatin (CRESTOR) 20 MG tablet Take 1 tablet (20 mg total) by mouth daily. 90 tablet 3   No current facility-administered medications on file prior to visit.    LABS/IMAGING: No results found for this or any previous visit (from the past 48 hour(s)). No results found.  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   Ms. Schmoll seems to be tolerating rosuvastatin 20 mg daily.  Genetic testing did identify 2 variants of unknown significance including the LDL receptor and APO E protein (E3/E4 heterozygous)-given this combination, would be expected she has increased LDL cholesterol as demonstrated and would be considered a compound heterozygote.  We will plan repeat lipid NMR and she is fasting today.  She had an elevated LP(a) but mildly elevated at 81.  I expect then her cholesterol will come down nicely since the majority of her LDL cholesterol is LP(a).  Plan follow-up with me in 6 months or sooner as necessary  Pixie Casino, MD, [facility], [place] Director of the Advanced Lipid Disorders &  Cardiovascular Risk Reduction Clinic Diplomate of the American Board of Clinical Lipidology Attending Cardiologist  Direct Dial: [phone]  Fax: [phone]  Website:  [email or web address] 12/12/2021, 8:41 AM

## 2021-12-12 NOTE — Patient Instructions (Signed)
Medication Instructions:  NO CHANGES today   *If you need a refill on your cardiac medications before your next appointment, please call your pharmacy*  Lab Work: NMR lipoprofile today   If you have labs (blood work) drawn today and your tests are completely normal, you will receive your results only by: MyChart Message (if you have MyChart) OR A paper copy in the mail If you have any lab test that is abnormal or we need to change your treatment, we will call you to review the results.  Follow-Up: At [facility], you and your health needs are our priority.  As part of our continuing mission to provide you with exceptional heart care, we have created designated Provider Care Teams.  These Care Teams include your primary Cardiologist (physician) and Advanced Practice Providers (APPs -  Physician Assistants and Nurse Practitioners) who all work together to provide you with the care you need, when you need it.  We recommend signing up for the patient portal called "MyChart".  Sign up information is provided on this After Visit Summary.  MyChart is used to connect with patients for Virtual Visits (Telemedicine).  Patients are able to view lab/test results, encounter notes, upcoming appointments, etc.  Non-urgent messages can be sent to your provider as well.   To learn more about what you can do with MyChart, go to [email or web address].    Your next appointment:   6 months   Provider:   Zoila Shutter MD

## 2021-12-13 LAB — NMR, LIPOPROFILE
Cholesterol, Total: 165 mg/dL (ref 100–199)
HDL Particle Number: 43.7 umol/L (ref >=30.5)
HDL-C: 74 mg/dL (ref >39)
LDL Particle Number: 848 nmol/L (ref <1000)
LDL Size: 21 nm (ref >20.5)
LDL-C (NIH Calc): 81 mg/dL (ref 0–99)
LP-IR Score: <25 (ref <=45)
Small LDL Particle Number: 416 nmol/L (ref <=527)
Triglycerides: 45 mg/dL (ref 0–149)

## 2022-04-18 ENCOUNTER — Other Ambulatory Visit: Payer: Self-pay | Admitting: *Deleted

## 2022-04-18 DIAGNOSIS — E7849 Other hyperlipidemia: Secondary | ICD-10-CM

## 2022-04-24 ENCOUNTER — Ambulatory Visit: Payer: Federal, State, Local not specified - PPO | Admitting: Family Medicine

## 2022-04-24 ENCOUNTER — Ambulatory Visit: Payer: Self-pay

## 2022-04-24 ENCOUNTER — Encounter: Payer: Self-pay | Admitting: Family Medicine

## 2022-04-24 VITALS — BP 121/75 | Ht 68.0 in | Wt 185.0 lb

## 2022-04-24 DIAGNOSIS — M25561 Pain in right knee: Secondary | ICD-10-CM

## 2022-04-24 NOTE — Progress Notes (Signed)
    SUBJECTIVE:   CHIEF COMPLAINT / HPI:   Patient is a 56 year old female who presents with right knee pain. Patient reports chronic intermittent pain that has been going on for years Rates pain 1 or 2 out of 10 but this has worsened to about 6/10 two weeks ago Pt thinks this could be related to walking on concrete during her vacation Pain is localized to the lateral inferior aspect of the right knee and she has noticed intermittent swelling. Denies any trauma to the knee and no recent sports. Played lacrosse and hockey back in college days. She reports some weight loss over the years Reports daily exercise which includes walking up to 20,000 steps a day. She has recently cut down on walking and increased biking. She has tried knee strengthen exercise which was limited by the knee pain  PERTINENT  PMH / PSH:   OBJECTIVE:   BP 121/75   Ht 5\' 8"  (1.727 m)   Wt 185 lb (83.9 kg)   LMP 01/21/2017 (Approximate)   BMI 28.13 kg/m     Physical Exam General: Alert, well appearing, NAD Right knee: No swelling, erythema or warmth of the right knee. Mild tenderness on palpation of the lateral aspect of the R knee over distal IT band and including insertion.  No joint line tenderness. FROM and normal strength.  Negative Valgus, Varus, posterior and anterior drawers. Negative mcmurray, apley, thessaly.  Crepitus present on the right knee.  Limited MSK u/s Right knee:  No effusion.  Anterior lateral meniscus intact.  Distal it band thickened near insertion with small partial tear.  ASSESSMENT/PLAN:   Chronic Knee pain, Right Patient with chronic knee pain presents with worsening pain in the last 2 weeks. Her exam was reassuring with stable knee joint. Right knee US shows mild tear in the IT band. 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 Recommend IT band strengthening and stretching.  Follow-up in 6 weeks for reassessment.   Alen Bleacher,  MD [place]

## 2022-04-24 NOTE — Patient Instructions (Signed)
You have IT band syndrome and a small partial tear of this at the insertion. Avoid painful activities as much as possible. Ice over area of pain 3-4 times a day for 15 minutes at a time Hip side raise exercise 3 sets of 10 once a day - add weights if this becomes too easy. Stretches - pick 2-3 and hold for 20-30 seconds x 3 - do once or twice a day. Tylenol and/or aleve as needed for pain. Continue with rolling but not over bony prominences. If not improving, can consider physical therapy. Follow up with me in 1 month.

## 2022-05-02 ENCOUNTER — Encounter: Payer: Self-pay | Admitting: Internal Medicine

## 2022-05-24 ENCOUNTER — Ambulatory Visit: Payer: Federal, State, Local not specified - PPO | Admitting: Family Medicine

## 2022-05-24 ENCOUNTER — Encounter: Payer: Self-pay | Admitting: Family Medicine

## 2022-05-24 VITALS — BP 110/78 | Ht 68.0 in | Wt 185.0 lb

## 2022-05-24 DIAGNOSIS — M25561 Pain in right knee: Secondary | ICD-10-CM

## 2022-05-24 NOTE — Progress Notes (Signed)
PCP: Caren Macadam, MD (Inactive)  Subjective:   HPI: Patient is a 56 y.o. female here for right knee pain.  10/2: Patient is a 56 year old female who presents with right knee pain. Patient reports chronic intermittent pain that has been going on for years Rates pain 1 or 2 out of 10 but this has worsened to about 6/10 two weeks ago Pt thinks this could be related to walking on concrete during her vacation Pain is localized to the lateral inferior aspect of the right knee and she has noticed intermittent swelling. Denies any trauma to the knee and no recent sports. Played lacrosse and hockey back in college days. She reports some weight loss over the years Reports daily exercise which includes walking up to 20,000 steps a day. She has recently cut down on walking and increased biking. She has tried knee strengthen exercise which was limited by the knee pain  11/1: Patient reports she has been improving though hasn't really tested her knee out much yet. Feels pain lateral knee if she does a deep squat. Hasn't gone back to using rowing machine yet. Doing home exercises and stretches regularly.  Past Medical History:  Diagnosis Date   Headache(784.0)     Current Outpatient Medications on File Prior to Visit  Medication Sig Dispense Refill   Fexofenadine HCl (ALLEGRA PO) Take by mouth as needed.     rosuvastatin (CRESTOR) 20 MG tablet Take 1 tablet (20 mg total) by mouth daily. 90 tablet 3   No current facility-administered medications on file prior to visit.    History reviewed. No pertinent surgical history.  No Known Allergies  BP 110/78   Ht 5\' 8"  (1.727 m)   Wt 185 lb (83.9 kg)   LMP 01/21/2017 (Approximate)   BMI 28.13 kg/m       No data to display              No data to display              Objective:  Physical Exam:  Gen: NAD, comfortable in exam room  Right knee: No gross deformity, ecchymoses, effusion. Mild TTP distal IT band at  insertion.  No joint line or other tenderness. FROM with normal strength. Negative ant/post drawers. Negative valgus/varus testing. Negative lachman.  Negative mcmurrays, apleys.  NV intact distally.   Assessment & Plan:  1. Right knee pain - 2/2 IT band syndrome with small insertional tear.  She is improving.  Continue with home exercises and stretches.  Discussed avoidance of deep squats, lunges, leg press.  Wait a few weeks before returning to rowing given amount of knee flexion with this.  Tylenol, ibuprofen if needed.  Icing if needed.

## 2022-05-29 DIAGNOSIS — E7849 Other hyperlipidemia: Secondary | ICD-10-CM | POA: Diagnosis not present

## 2022-05-30 LAB — NMR, LIPOPROFILE
Cholesterol, Total: 211 mg/dL — ABNORMAL HIGH (ref 100–199)
HDL Particle Number: 43.7 umol/L (ref >=30.5)
HDL-C: 73 mg/dL (ref >39)
LDL Particle Number: 1204 nmol/L — ABNORMAL HIGH (ref <1000)
LDL Size: 21.5 nm (ref >20.5)
LDL-C (NIH Calc): 127 mg/dL — ABNORMAL HIGH (ref 0–99)
LP-IR Score: <25 (ref <=45)
Small LDL Particle Number: 334 nmol/L (ref <=527)
Triglycerides: 60 mg/dL (ref 0–149)

## 2022-06-01 ENCOUNTER — Ambulatory Visit: Payer: Federal, State, Local not specified - PPO | Attending: Internal Medicine | Admitting: Internal Medicine

## 2022-06-01 ENCOUNTER — Encounter: Payer: Self-pay | Admitting: Internal Medicine

## 2022-06-01 VITALS — BP 126/72 | HR 65 | Ht 68.0 in | Wt 193.0 lb

## 2022-06-01 DIAGNOSIS — I2510 Atherosclerotic heart disease of native coronary artery without angina pectoris: Secondary | ICD-10-CM

## 2022-06-01 DIAGNOSIS — I2584 Coronary atherosclerosis due to calcified coronary lesion: Secondary | ICD-10-CM | POA: Diagnosis not present

## 2022-06-01 DIAGNOSIS — E7849 Other hyperlipidemia: Secondary | ICD-10-CM | POA: Diagnosis not present

## 2022-06-01 DIAGNOSIS — E7841 Elevated Lipoprotein(a): Secondary | ICD-10-CM | POA: Diagnosis not present

## 2022-06-01 MED ORDER — ROSUVASTATIN CALCIUM 5 MG PO TABS: 5.0000 mg | ORAL_TABLET | Freq: Every day | ORAL | 3 refills | Status: DC

## 2022-06-01 MED ORDER — EZETIMIBE 10 MG PO TABS: 10.0000 mg | ORAL_TABLET | Freq: Every day | ORAL | 3 refills | Status: DC

## 2022-06-01 NOTE — Patient Instructions (Signed)
Medication Instructions:  START new dose of crestor 5mg  daily  START zetia 10mg  daily  *If you need a refill on your cardiac medications before your next appointment, please call your pharmacy*   Lab Work: FASTING lab work to check cholesterol in about 3-4 months   If you have labs (blood work) drawn today and your tests are completely normal, you will receive your results only by: MyChart Message (if you have MyChart) OR A paper copy in the mail If you have any lab test that is abnormal or we need to change your treatment, we will call you to review the results.   Follow-Up: At [facility], you and your health needs are our priority.  As part of our continuing mission to provide you with exceptional heart care, we have created designated Provider Care Teams.  These Care Teams include your primary Cardiologist (physician) and Advanced Practice Providers (APPs -  Physician Assistants and Nurse Practitioners) who all work together to provide you with the care you need, when you need it.  We recommend signing up for the patient portal called "MyChart".  Sign up information is provided on this After Visit Summary.  MyChart is used to connect with patients for Virtual Visits (Telemedicine).  Patients are able to view lab/test results, encounter notes, upcoming appointments, etc.  Non-urgent messages can be sent to your provider as well.   To learn more about what you can do with MyChart, go to .    Your next appointment:    3-4 months with Dr. [facility]

## 2022-06-01 NOTE — Progress Notes (Signed)
LIPID CLINIC CONSULT NOTE  Chief Complaint:  Follow-up dyslipidemia  Primary Care Physician: Wynn Banker, MD (Inactive)  Primary Cardiologist:  None  HPI:  Laura Ward is a 56 y.o. female who is being seen today for the evaluation of dyslipidemia. 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  This showed 3 areas of punctate plaque in the proximal to mid and distal RCA with a calcium score of 16.5, 85th percentile for age and sex matched control.  Additionally, she had recent lipid testing which was a lipoprotein particle study.  Her LDL-P was 2155 with an LDL-C of 197, HDL-C81, triglycerides 51 and total cholesterol 285.  Small LDL particle numbers were minimally elevated at 537 and LDL size was larger at 21.4 .  In the past she had been on Lipitor but had potentially some side effects from the medication.  Recently she had worked on weight loss and more healthy lifestyle and actually had lost weight and exercises regularly, but despite this her cholesterol numbers have gone up.  She does not take any medications.  She reports no family history of heart disease in either of her parents although high cholesterol had been noted.  She eats a fairly low-fat diet and recently was on a weight loss dietary plan.  He does not participate in the ketogenic or high fat diet.  06/01/2022  Laura Ward returns today for follow-up.  She had a recent increase in her lipids.  Her LDL particle number is gone up from [phone], LDL-C of 127, HDL-C of 73 and small LDL particle number remains low at 334.  The increase is likely due to her weaning off medications due to concern for side effects.  She had then restarted her statin back in late September but I think it is too early to see the maximal results from this.  She seemed to have worsening muscle and joint aches on the statin but was pleased  with the degree of lipid lowering it provided.  PMHx:  Past Medical History:  Diagnosis Date   Headache(784.0)     No past surgical history on file.  FAMHx:  Family History  Problem Relation Age of Onset   Breast cancer Mother 86       TAH-BSO in 22s - benign reasons   Atrial fibrillation Mother    Hyperlipidemia Mother    Pancreatic cancer Maternal Grandmother 60   Colon cancer Maternal Grandfather 50   Arthritis Father    Heart disease Father        SVT   Heart disease Paternal Grandmother        died during heart surgery   Heart disease Paternal Grandfather    Stroke Paternal Grandfather     SOCHx:   reports that she has never smoked. She has never used smokeless tobacco. She reports that she does not drink alcohol and does not use drugs.  ALLERGIES:  No Known Allergies  ROS: Pertinent items noted in HPI and remainder of comprehensive ROS otherwise negative.  HOME MEDS: Current Outpatient Medications on File Prior to Visit  Medication Sig Dispense Refill   Fexofenadine HCl (ALLEGRA PO) Take by mouth as needed.     rosuvastatin (CRESTOR) 20 MG tablet Take 1 tablet (20 mg total) by mouth daily. 90 tablet 3   No current facility-administered medications on file prior to visit.    LABS/IMAGING: No results found for  this or any previous visit (from the past 48 hour(s)). No results found.  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   Laura Ward has had some side effects on rosuvastatin.  She seems to be better on every other day dosing or when she weaned to 2 stop but has again developed some side effects after restarting the medication.  Based on this, she is likely intolerant at this dose level.  She is interested in trying a lower dose and I think combination therapy may still give her benefits.  We will plan to decrease her rosuvastatin to 5 mg daily but add ezetimibe 10 mg daily for additional benefit.  Repeat lipid NMR in about 3 to 4 months and follow-up afterwards.  Lynelle Doctor, MD, [facility], FACP    [facility]  Medical Director of the Advanced Lipid Disorders &  Cardiovascular Risk Reduction Clinic Diplomate of the American Board of Clinical Lipidology Attending Cardiologist  Direct Dial: [phone]  Fax: [phone]  Website:  [email or web address] Laura Ward 06/01/2022, 8:07 AM

## 2022-06-21 ENCOUNTER — Telehealth: Payer: Self-pay | Admitting: *Deleted

## 2022-06-21 NOTE — Telephone Encounter (Signed)
Needs to have triage colonoscopy  [facility]

## 2022-06-21 NOTE — Telephone Encounter (Signed)
  Procedure: colonoscopy  Height: 5'8" Weight: 185 lb      BMI: 28.1  Have you had a colonoscopy before?  No  Do you have family history of colon cancer?  Yes, grandfather  Do you have a family history of polyps? No  Previous colonoscopy with polyps removed? No  Do you have a history colorectal cancer?   No  Are you diabetic?  No  Do you have a prosthetic or mechanical heart valve? No  Do you have a pacemaker/defibrillator?   NO  Have you had endocarditis/atrial fibrillation?  No  Do you use supplemental oxygen/CPAP?  No  Have you had joint replacement within the last 12 months?  No  Do you tend to be constipated or have to use laxatives?  No   Do you have history of alcohol use? If yes, how much and how often.  Yes, 2 drinks per week  Do you have history or are you using drugs? If yes, what do are you  using?  No  Have you ever had a stroke/heart attack?  No  Have you ever had a heart or other vascular stent placed,?No  Do you take weight loss medication? No  female patients,: have you had a hysterectomy? No                              are you post menopausal?  Yes                              do you still have your menstrual cycle? No    Date of last menstrual period? 4 years ago  Do you take any blood-thinning medications such as: (Plavix, aspirin, Coumadin, Aggrenox, Brilinta, Xarelto, Eliquis, Pradaxa, Savaysa or Effient)? No  If yes we need the name, milligram, dosage and who is prescribing doctor:  N/A  Current Outpatient Medications  Medication Sig Dispense Refill   ezetimibe (ZETIA) 10 MG tablet Take 1 tablet (10 mg total) by mouth daily. 90 tablet 3   Fexofenadine HCl (ALLEGRA PO) Take by mouth as needed.     rosuvastatin (CRESTOR) 5 MG tablet Take 1 tablet (5 mg total) by mouth daily. 90 tablet 3   No current facility-administered medications for this visit.    No Known Allergies

## 2022-06-22 ENCOUNTER — Encounter: Payer: Self-pay | Admitting: *Deleted

## 2022-06-22 MED ORDER — CLENPIQ 10-3.5-12 MG-GM -GM/175ML PO SOLN: 1.0000 | ORAL | 0 refills | Status: AC

## 2022-06-22 MED ORDER — PEG 3350-KCL-NA BICARB-NACL 420 G PO SOLR: 4000.0000 mL | Freq: Once | ORAL | 0 refills | Status: DC

## 2022-06-22 NOTE — Telephone Encounter (Signed)
Pt has been scheduled for 07/31/22 at 7:30 am. Instructions mailed and prep sent to the pharmacy.

## 2022-07-03 IMAGING — MG MM DIGITAL SCREENING BILAT W/ TOMO AND CAD
8 series · 9 of 24 positions shown · non-contrast
Comparison: Previous exam(s).

CLINICAL DATA: Screening.

EXAM:
DIGITAL SCREENING BILATERAL MAMMOGRAM WITH TOMOSYNTHESIS AND CAD
TECHNIQUE: Bilateral screening digital craniocaudal and mediolateral oblique
mammograms were obtained. Bilateral screening digital breast
tomosynthesis was performed. The images were evaluated with
computer-aided detection.

[L CC synth-2D]
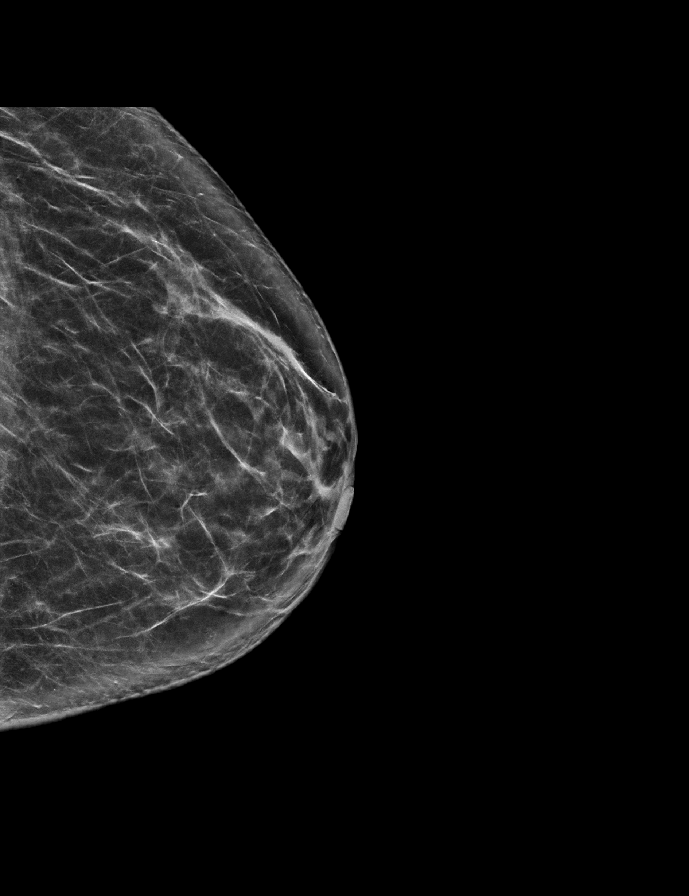

[R CC synth-2D]
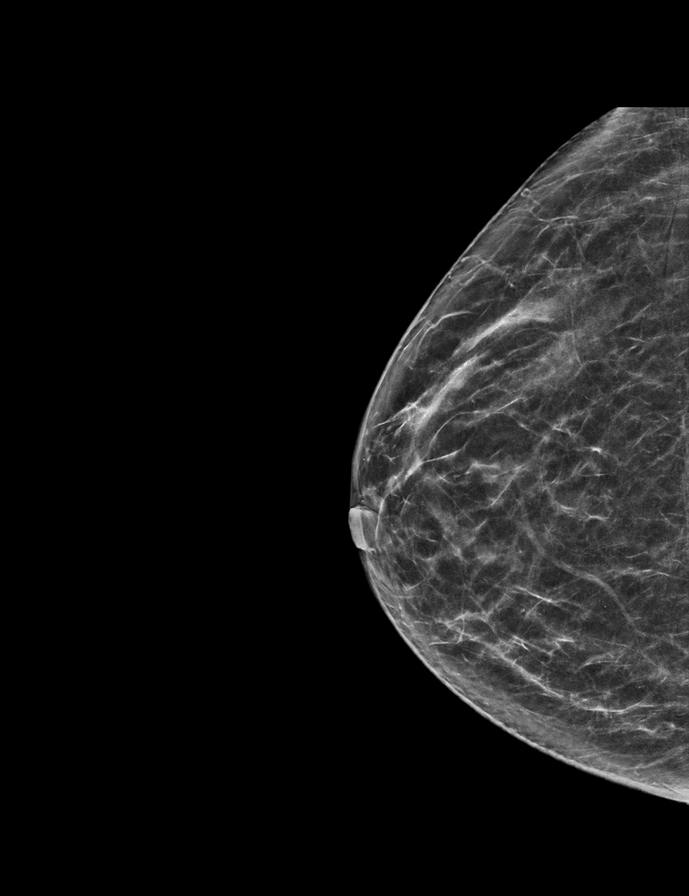

[L MLO synth-2D]
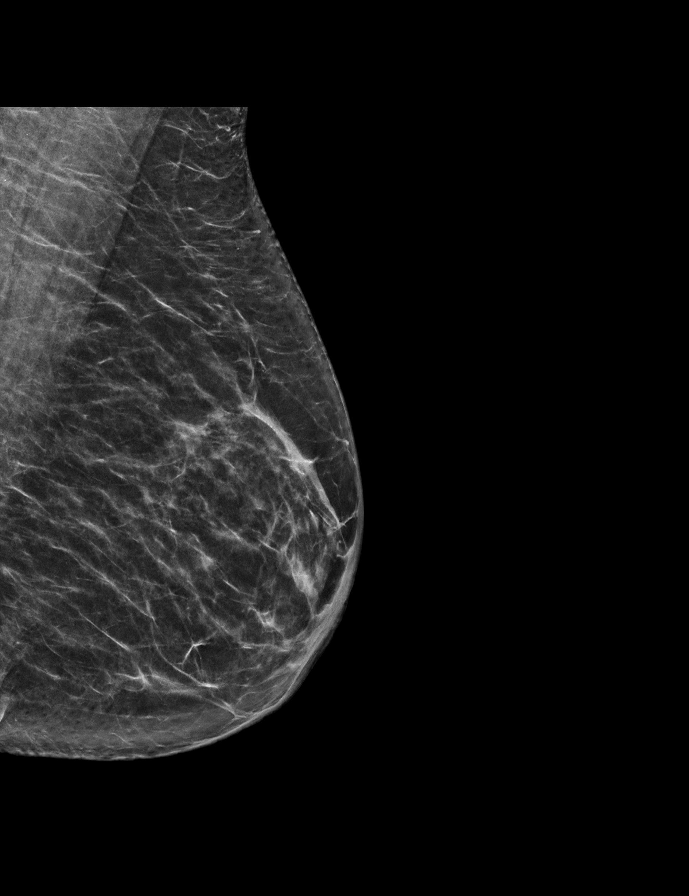

[R MLO synth-2D]
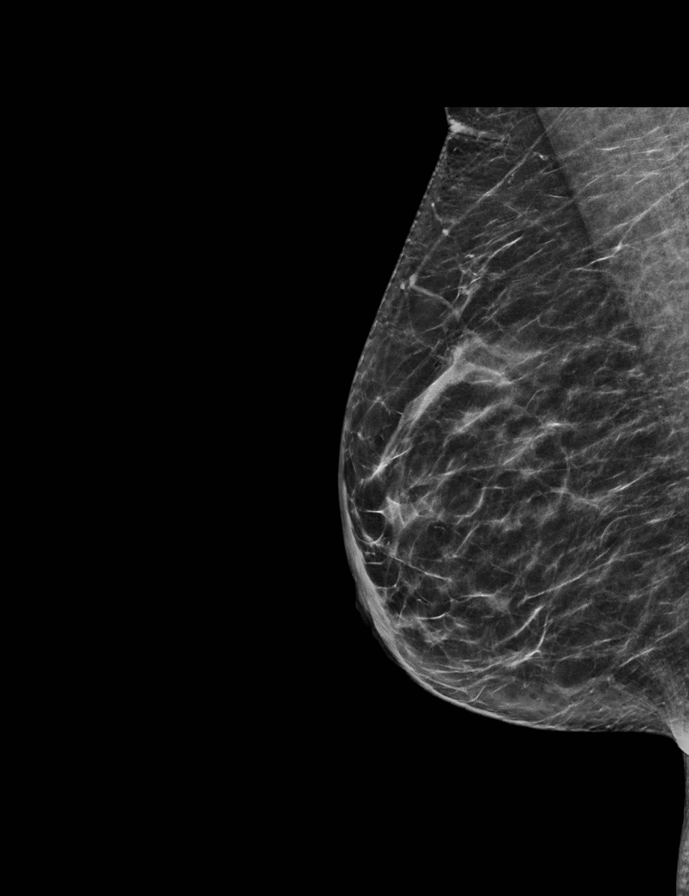

[R MLO tomo · 2 of 59 frames shown]
[frame 20/59]
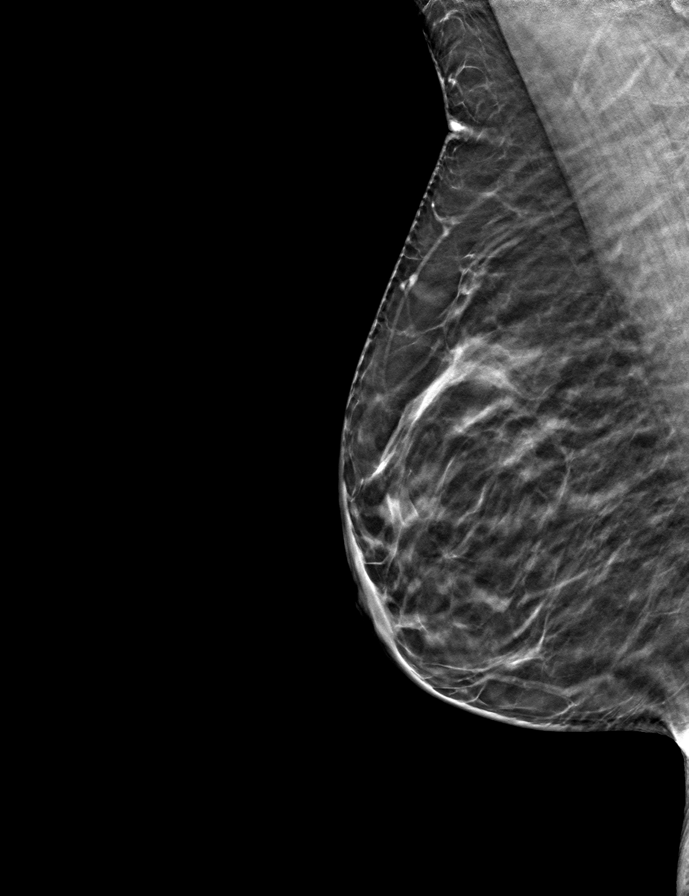
[frame 30/59]
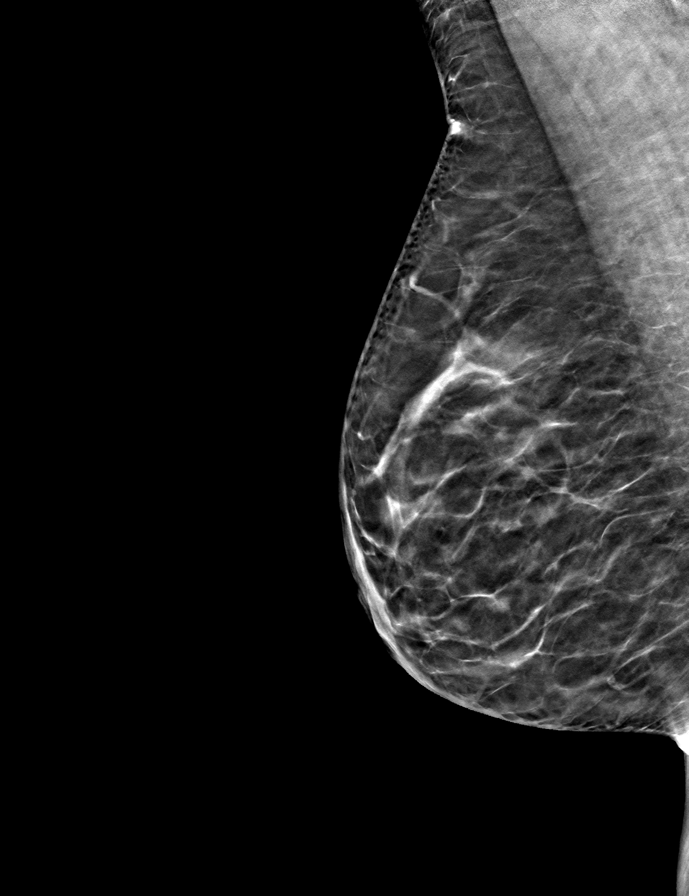

[R CC tomo · tomo slice 30/59.0]
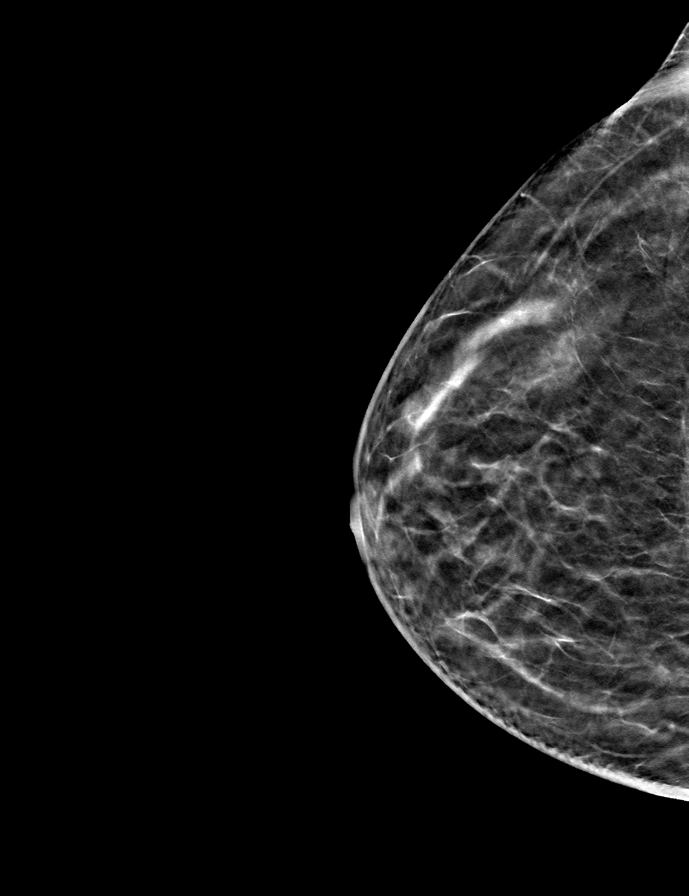

[L MLO tomo · tomo slice 31/62.0]
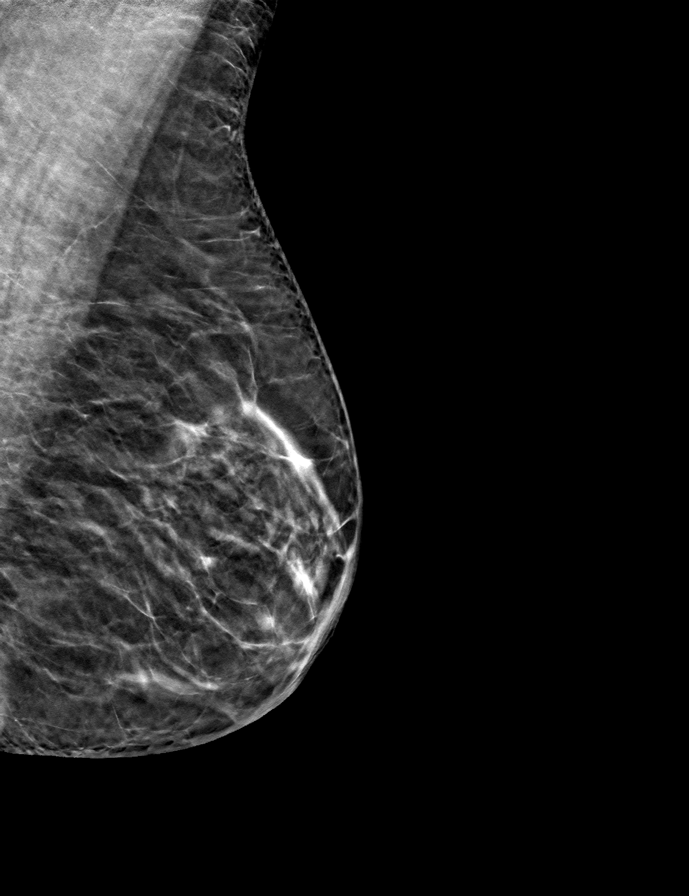

[L CC tomo · tomo slice 33/65.0]
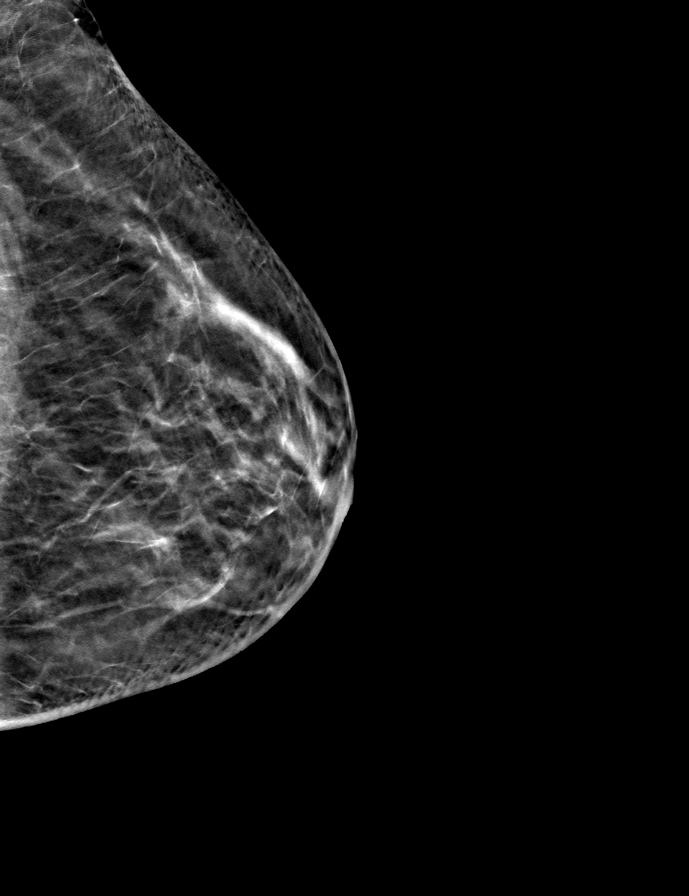

[9 of 24 positions shown; findings below may reference images not displayed]

ACR Breast Density Category b: There are scattered areas of
fibroglandular density.
FINDINGS: There are no findings suspicious for malignancy.
IMPRESSION: No mammographic evidence of malignancy. A result letter of this
screening mammogram will be mailed directly to the patient.

RECOMMENDATION:
Screening mammogram in one year. (Code:51-O-LD2)

BI-RADS CATEGORY  1: Negative.

## 2022-07-25 ENCOUNTER — Encounter: Payer: Self-pay | Admitting: *Deleted

## 2022-08-20 ENCOUNTER — Encounter (HOSPITAL_COMMUNITY): Payer: Self-pay | Admitting: Anesthesiology

## 2022-08-21 ENCOUNTER — Ambulatory Visit (HOSPITAL_COMMUNITY)
Admission: RE | Admit: 2022-08-21 | Payer: Federal, State, Local not specified - PPO | Source: Ambulatory Visit | Admitting: Internal Medicine

## 2022-08-21 ENCOUNTER — Encounter (HOSPITAL_COMMUNITY): Admission: RE | Payer: Self-pay | Source: Ambulatory Visit

## 2022-08-21 DIAGNOSIS — Z1211 Encounter for screening for malignant neoplasm of colon: Secondary | ICD-10-CM

## 2022-08-21 SURGERY — COLONOSCOPY WITH PROPOFOL
Anesthesia: Monitor Anesthesia Care

## 2022-08-28 ENCOUNTER — Telehealth: Payer: Self-pay | Admitting: *Deleted

## 2022-08-28 NOTE — Telephone Encounter (Signed)
Received notice from Endo last week that pt cancelled her procedure.  Reaching out to pt to see if she would like to reschedule.  Laura Ward

## 2022-09-13 DIAGNOSIS — E7849 Other hyperlipidemia: Secondary | ICD-10-CM | POA: Diagnosis not present

## 2022-09-13 DIAGNOSIS — E7841 Elevated Lipoprotein(a): Secondary | ICD-10-CM | POA: Diagnosis not present

## 2022-09-14 LAB — NMR, LIPOPROFILE
Cholesterol, Total: 176 mg/dL (ref 100–199)
HDL Particle Number: 42.7 umol/L (ref >=30.5)
HDL-C: 69 mg/dL (ref >39)
LDL Particle Number: 1099 nmol/L — ABNORMAL HIGH (ref <1000)
LDL Size: 21.3 nm (ref >20.5)
LDL-C (NIH Calc): 94 mg/dL (ref 0–99)
LP-IR Score: 25 (ref <=45)
Small LDL Particle Number: 327 nmol/L (ref <=527)
Triglycerides: 71 mg/dL (ref 0–149)

## 2022-09-15 ENCOUNTER — Ambulatory Visit: Payer: Federal, State, Local not specified - PPO | Attending: Internal Medicine

## 2022-09-15 ENCOUNTER — Encounter: Payer: Self-pay | Admitting: Internal Medicine

## 2022-09-15 ENCOUNTER — Ambulatory Visit: Payer: Federal, State, Local not specified - PPO | Attending: Internal Medicine | Admitting: Internal Medicine

## 2022-09-15 VITALS — BP 110/78 | HR 76 | Ht 68.0 in | Wt 196.4 lb

## 2022-09-15 DIAGNOSIS — E7841 Elevated Lipoprotein(a): Secondary | ICD-10-CM

## 2022-09-15 DIAGNOSIS — E7849 Other hyperlipidemia: Secondary | ICD-10-CM

## 2022-09-15 DIAGNOSIS — I4891 Unspecified atrial fibrillation: Secondary | ICD-10-CM

## 2022-09-15 DIAGNOSIS — Z1329 Encounter for screening for other suspected endocrine disorder: Secondary | ICD-10-CM

## 2022-09-15 DIAGNOSIS — I480 Paroxysmal atrial fibrillation: Secondary | ICD-10-CM

## 2022-09-15 NOTE — Progress Notes (Signed)
LIPID CLINIC CONSULT NOTE  Chief Complaint:  Palpitations  Primary Care Physician: Patient, No Pcp Per  Primary Cardiologist:  None  HPI:  Laura Ward is a 57 y.o. female who is being seen today for the evaluation of dyslipidemia. 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  This showed 3 areas of punctate plaque in the proximal to mid and distal RCA with a calcium score of 16.5, 85th percentile for age and sex matched control.  Additionally, she had recent lipid testing which was a lipoprotein particle study.  Her LDL-P was 2155 with an LDL-C of 197, HDL-C81, triglycerides 51 and total cholesterol 285.  Small LDL particle numbers were minimally elevated at 537 and LDL size was larger at 21.4 .  In the past she had been on Lipitor but had potentially some side effects from the medication.  Recently she had worked on weight loss and more healthy lifestyle and actually had lost weight and exercises regularly, but despite this her cholesterol numbers have gone up.  She does not take any medications.  She reports no family history of heart disease in either of her parents although high cholesterol had been noted.  She eats a fairly low-fat diet and recently was on a weight loss dietary plan.  He does not participate in the ketogenic or high fat diet.  06/01/2022  Laura Ward returns today for follow-up.  She had a recent increase in her lipids.  Her LDL particle number is gone up from [phone], LDL-C of 127, HDL-C of 73 and small LDL particle number remains low at 334.  The increase is likely due to her weaning off medications due to concern for side effects.  She had then restarted her statin back in late September but I think it is too early to see the maximal results from this.  She seemed to have worsening muscle and joint aches on the statin but was pleased with the degree of lipid  lowering it provided.  09/15/2022  Laura Ward returns today for follow-up.  She reports she has been trying to take her rosuvastatin more regularly but still is having some muscle soreness with the low-dose 5 mg.  She may take it 3-4 times a week.  She is also taking the Zetia on a daily basis.  She did miss some medications while she was out of town.  Her cholesterol has improved somewhat.  Her LDL particle number is down to 1099 from 1204.  Her LDL-C is 94 down from 127.  Today she also mention that she been having some recent palpitations.  The other day she had an episode that lasted for about 2 minutes while at work.  This was concerning for A-fib.  Heart rate was about 118.  She was able to capture the event on her Apple Watch which I personally reviewed and it does demonstrate atrial fibrillation.  She also has captured some PVCs.  She reports her mother has atrial fibrillation which she developed also in her 56s.  We discussed sleep and the risk of sleep apnea as a possible contributor for this.  She does report some snoring but generally gets restful sleep.  We might need to consider a sleep study.  PMHx:  Past Medical History:  Diagnosis Date   Headache(784.0)     No past surgical history on file.  FAMHx:  Family History  Problem Relation Age of  Onset   Breast cancer Mother 32       TAH-BSO in 80s - benign reasons   Atrial fibrillation Mother    Hyperlipidemia Mother    Pancreatic cancer Maternal Grandmother 89   Colon cancer Maternal Grandfather 60   Arthritis Father    Heart disease Father        SVT   Heart disease Paternal Grandmother        died during heart surgery   Heart disease Paternal Grandfather    Stroke Paternal Grandfather     SOCHx:   reports that she has never smoked. She has never used smokeless tobacco. She reports that she does not drink alcohol and does not use drugs.  ALLERGIES:  No Known Allergies  ROS: Pertinent items noted in HPI and remainder  of comprehensive ROS otherwise negative.  HOME MEDS: Current Outpatient Medications on File Prior to Visit  Medication Sig Dispense Refill   acetaminophen (TYLENOL) 500 MG tablet Take 1,000 mg by mouth every 6 (six) hours as needed for moderate pain.     Coenzyme Q10 (COQ-10 PO) Take 1 tablet by mouth daily.     doxylamine, Sleep, (UNISOM) 25 MG tablet Take 12.5 mg by mouth at bedtime as needed for sleep.     ezetimibe (ZETIA) 10 MG tablet Take 1 tablet (10 mg total) by mouth daily. 90 tablet 3   fexofenadine (ALLEGRA) 180 MG tablet Take 180 mg by mouth daily as needed for allergies or rhinitis.     naproxen sodium (ALEVE) 220 MG tablet Take 220 mg by mouth daily as needed (pain).     rosuvastatin (CRESTOR) 5 MG tablet Take 1 tablet (5 mg total) by mouth daily. 90 tablet 3   Sod Picosulfate-Mag Ox-Cit Acd (CLENPIQ) 10-3.5-12 MG-GM -GM/175ML SOLN Take 1 kit by mouth as directed. 350 mL 0   No current facility-administered medications on file prior to visit.    LABS/IMAGING: No results found for this or any previous visit (from the past 48 hour(s)). No results found.  LIPID PANEL:    Component Value Date/Time   CHOL 275 (H) 06/23/2019 0951   TRIG 98.0 06/23/2019 0951   HDL 58.60 06/23/2019 0951   CHOLHDL 5 06/23/2019 0951   VLDL 19.6 06/23/2019 0951   LDLCALC 197 (H) 06/23/2019 0951    WEIGHTS: Wt Readings from Last 3 Encounters:  09/15/22 196 lb 6.4 oz (89.1 kg)  06/01/22 193 lb (87.5 kg)  05/24/22 185 lb (83.9 kg)    VITALS: BP 110/78   Pulse 76   Ht '5\' 8"'$  (1.727 m)   Wt 196 lb 6.4 oz (89.1 kg)   LMP 01/21/2017 (Approximate)   SpO2 98%   BMI 29.86 kg/m   EXAM: General appearance: alert and no distress Lungs: clear to auscultation bilaterally Heart: regular rate and rhythm Extremities: extremities normal, atraumatic, no cyanosis or edema Neurologic: Grossly normal  EKG: Deferred  ASSESSMENT: Paroxysmal atrial fibrillation-CHA2DS2-VASc score of 1  (female) Familial hyperlipidemia, LDL greater than 190 2 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   Laura Ward seems to have had a brief episode of atrial fibrillation.  She may have had others but it is paroxysmal.  Her only risk factor is being female and therefore does not necessitate anticoagulation at this time.  Most literature suggests early aggressive intervention may help prevent worsening issues down the road.  I would like to place a 2-week monitor to see what her burden of atrial fibrillation is.  She could also monitor it using her Apple Watch and she was symptomatic.  Will plan follow-up in a few months.  Will repeat a TSH and a lipid profile and consider an outpatient sleep study.  Her lipids have improved and I encouraged her to take her statin and Zetia is much as possible.  Pixie Casino, MD, [facility], [place] Director of the Advanced Lipid Disorders &  Cardiovascular Risk Reduction Clinic Diplomate of the American Board of Clinical Lipidology Attending Cardiologist  Direct Dial: [phone]  Fax: [phone]  Website:  [email or web address] 09/15/2022, 8:38 AM

## 2022-09-15 NOTE — Progress Notes (Unsigned)
Enrolled patient for a 14 day Zio XT  monitor to be mailed to patients home  °

## 2022-09-15 NOTE — Patient Instructions (Signed)
Medication Instructions:  Your physician recommends that you continue on your current medications as directed. Please refer to the Current Medication list given to you today.  *If you need a refill on your cardiac medications before your next appointment, please call your pharmacy*   Lab Work: FASTING NMR lipoprofile and TSH in about 3 months -- before next appointment   If you have labs (blood work) drawn today and your tests are completely normal, you will receive your results only by: Laura Ward (if you have MyChart) OR A paper copy in the mail If you have any lab test that is abnormal or we need to change your treatment, we will call you to review the results.   Testing/Procedures: Laura Ward- Long Term Monitor Instructions  Your physician has requested you wear a ZIO patch monitor for 14 days.  This is a single patch monitor. [employer] supplies one patch monitor per enrollment. Additional stickers are not available. Please do not apply patch if you will be having a Nuclear Stress Test,  Echocardiogram, Cardiac CT, MRI, or Chest Xray during the period you would be wearing the  monitor. The patch cannot be worn during these tests. You cannot remove and re-apply the  ZIO XT patch monitor.  Your ZIO patch monitor will be mailed 3 day USPS to your address on file. It may take 3-5 days  to receive your monitor after you have been enrolled.  Once you have received your monitor, please review the enclosed instructions. Your monitor  has already been registered assigning a specific monitor serial # to you.  Billing and Patient Assistance Program Information  We have supplied [employer] with any of your insurance information on file for billing purposes. [employer] offers a sliding scale Patient Assistance Program for patients that do not have  insurance, or whose insurance does not completely cover the cost of the ZIO monitor.  You must apply for the Patient Assistance Program to qualify for  this discounted rate.  To apply, please call [employer] at [phone], select option 4, select option 2, ask to apply for  Patient Assistance Program. Laura Ward will ask your household income, and how many people  are in your household. They will quote your out-of-pocket cost based on that information.  [employer] will also be able to set up a 68-month interest-free payment plan if needed.  Applying the monitor   Shave hair from upper left chest.  Hold abrader disc by orange tab. Rub abrader in 40 strokes over the upper left chest as  indicated in your monitor instructions.  Clean area with 4 enclosed alcohol pads. Let dry.  Apply patch as indicated in monitor instructions. Patch will be placed under collarbone on left  side of chest with arrow pointing upward.  Rub patch adhesive wings for 2 minutes. Remove white label marked "1". Remove the white  label marked "2". Rub patch adhesive wings for 2 additional minutes.  While looking in a mirror, press and release button in center of patch. A small green light will  flash 3-4 times. This will be your only indicator that the monitor has been turned on.  Do not shower for the first 24 hours. You may shower after the first 24 hours.  Press the button if you feel a symptom. You will hear a small click. Record Date, Time and  Symptom in the Patient Logbook.  When you are ready to remove the patch, follow instructions on the last 2 pages of Patient  Logbook. Stick  patch monitor onto the last page of Patient Logbook.  Place Patient Logbook in the blue and white box. Use locking tab on box and tape box closed  securely. The blue and white box has prepaid postage on it. Please place it in the mailbox as  soon as possible. Your physician should have your test results approximately 7 days after the  monitor has been mailed back to [facility].  Call [facility] at [phone] if you have questions regarding  your ZIO XT patch monitor.  Call them immediately if you see an orange light blinking on your  monitor.  If your monitor falls off in less than 4 days, contact our Monitor department at [phone].  If your monitor becomes loose or falls off after 4 days call [employer] at [phone] for  suggestions on securing your monitor    Follow-Up: At [name], you and your health needs are our priority.  As part of our continuing mission to provide you with exceptional heart care, we have created designated Provider Care Teams.  These Care Teams include your primary Cardiologist (physician) and Advanced Practice Providers (APPs -  Physician Assistants and Nurse Practitioners) who all work together to provide you with the care you need, when you need it.  We recommend signing up for the patient portal called "MyChart".  Sign up information is provided on this After Visit Summary.  MyChart is used to connect with patients for Virtual Visits (Telemedicine).  Patients are able to view lab/test results, encounter notes, upcoming appointments, etc.  Non-urgent messages can be sent to your provider as well.   To learn more about what you can do with MyChart, go to [email or web address].    Your next appointment:    3 months with [name]

## 2022-09-20 DIAGNOSIS — K080 Exfoliation of teeth due to systemic causes: Secondary | ICD-10-CM | POA: Diagnosis not present

## 2022-09-22 DIAGNOSIS — I4891 Unspecified atrial fibrillation: Secondary | ICD-10-CM | POA: Diagnosis not present

## 2022-10-03 ENCOUNTER — Encounter: Payer: Self-pay | Admitting: *Deleted

## 2022-10-03 NOTE — Telephone Encounter (Signed)
Pt called and rescheduled procedure for 11/20/22. New instructions mailed to pt.

## 2022-11-16 ENCOUNTER — Other Ambulatory Visit: Payer: Self-pay | Admitting: Family Medicine

## 2022-11-16 DIAGNOSIS — Z1231 Encounter for screening mammogram for malignant neoplasm of breast: Secondary | ICD-10-CM

## 2022-11-20 ENCOUNTER — Ambulatory Visit (HOSPITAL_COMMUNITY): Payer: Federal, State, Local not specified - PPO | Admitting: Anesthesiology

## 2022-11-20 ENCOUNTER — Other Ambulatory Visit: Payer: Self-pay

## 2022-11-20 ENCOUNTER — Ambulatory Visit (HOSPITAL_COMMUNITY)
Admission: RE | Admit: 2022-11-20 | Discharge: 2022-11-20 | Disposition: A | Discharge status: Home / Self Care | Payer: Federal, State, Local not specified - PPO | Attending: Internal Medicine | Admitting: Internal Medicine

## 2022-11-20 ENCOUNTER — Encounter (HOSPITAL_COMMUNITY): Payer: Self-pay | Admitting: Internal Medicine

## 2022-11-20 ENCOUNTER — Encounter (HOSPITAL_COMMUNITY)
Admission: RE | Disposition: A | Discharge status: Home / Self Care | Payer: Self-pay | Source: Home / Self Care | Attending: Internal Medicine

## 2022-11-20 DIAGNOSIS — Z800 Family history of malignant neoplasm of digestive organs: Secondary | ICD-10-CM | POA: Insufficient documentation

## 2022-11-20 DIAGNOSIS — Z1211 Encounter for screening for malignant neoplasm of colon: Secondary | ICD-10-CM | POA: Diagnosis not present

## 2022-11-20 DIAGNOSIS — D120 Benign neoplasm of cecum: Secondary | ICD-10-CM

## 2022-11-20 DIAGNOSIS — K635 Polyp of colon: Secondary | ICD-10-CM | POA: Diagnosis not present

## 2022-11-20 DIAGNOSIS — K6389 Other specified diseases of intestine: Secondary | ICD-10-CM | POA: Diagnosis not present

## 2022-11-20 HISTORY — PX: POLYPECTOMY: SHX149

## 2022-11-20 HISTORY — PX: COLONOSCOPY WITH PROPOFOL: SHX5780

## 2022-11-20 SURGERY — COLONOSCOPY WITH PROPOFOL
Anesthesia: General

## 2022-11-20 MED ORDER — LACTATED RINGERS IV SOLN: INTRAVENOUS | Status: DC

## 2022-11-20 MED ORDER — PROPOFOL 500 MG/50ML IV EMUL
INTRAVENOUS | Status: DC | PRN
  Administered 2022-11-20: 150 ug/kg/min via INTRAVENOUS

## 2022-11-20 MED ORDER — PROPOFOL 10 MG/ML IV BOLUS
INTRAVENOUS | Status: DC | PRN
  Administered 2022-11-20: 70 mg via INTRAVENOUS

## 2022-11-20 NOTE — Anesthesia Postprocedure Evaluation (Signed)
Anesthesia Post Note  Patient: Laura Ward  Procedure(s) Performed: COLONOSCOPY WITH PROPOFOL POLYPECTOMY INTESTINAL  Patient location during evaluation: Endoscopy Anesthesia Type: General Level of consciousness: awake and alert Pain management: pain level controlled Respiratory status: spontaneous breathing Cardiovascular status: blood pressure returned to baseline and stable Postop Assessment: no apparent nausea or vomiting Anesthetic complications: no   No notable events documented.   Last Vitals:  Vitals:   11/20/22 0837  BP: 120/78  Pulse: (!) 56  Resp: 15  Temp: 36.9 C  SpO2: 99%    Last Pain:  Vitals:   11/20/22 0948  TempSrc:   PainSc: 0-No pain                 Rebie Peale

## 2022-11-20 NOTE — Op Note (Signed)
[facility] Patient Name: Laura Ward Procedure Date: 11/20/2022 9:32 AM MRN: [id number] Date of Birth: [birth year] Attending MD: Gennette Pac , MD, [id number] CSN: [id number] Age: 57 Admit Type: Outpatient Procedure:                Colonoscopy Indications:              Screening for colorectal malignant neoplasm Providers:                Gennette Pac, MD, Crystal Page, Durwin Glaze Tech, Technician Referring MD:              Medicines:                Propofol per Anesthesia Complications:            No immediate complications. Estimated Blood Loss:     Estimated blood loss was minimal. Procedure:                Pre-Anesthesia Assessment:                           - Prior to the procedure, a History and Physical                            was performed, and patient medications and                            allergies were reviewed. The patient's tolerance of                            previous anesthesia was also reviewed. The risks                            and benefits of the procedure and the sedation                            options and risks were discussed with the patient.                            All questions were answered, and informed consent                            was obtained. Prior Anticoagulants: The patient has                            taken no anticoagulant or antiplatelet agents. ASA                            Grade Assessment: II - A patient with mild systemic                            disease. After reviewing the risks and benefits,  the patient was deemed in satisfactory condition to                            undergo the procedure.                           After obtaining informed consent, the colonoscope                            was passed under direct vision. Throughout the                            procedure, the patient's blood pressure, pulse, and                             oxygen saturations were monitored continuously. The                            [id number]) scope was introduced through the                            anus and advanced to the the cecum, identified by                            appendiceal orifice and ileocecal valve. The                            colonoscopy was performed without difficulty. The                            patient tolerated the procedure well. The quality                            of the bowel preparation was adequate. The                            ileocecal valve, appendiceal orifice, and rectum                            were photographed. Scope In: 9:52:46 AM Scope Out: 10:04:00 AM Scope Withdrawal Time: 0 hours 8 minutes 18 seconds  Total Procedure Duration: 0 hours 11 minutes 14 seconds  Findings:      The perianal and digital rectal examinations were normal.      Two sessile polyps were found in the cecum. The polyps were 2 to 3 mm in       size. These polyps were removed with a cold snare. Resection and       retrieval were complete. Estimated blood loss was minimal.      The exam was otherwise without abnormality on direct and retroflexion       views. Impression:               - Two 2 to 3 mm polyps in the cecum, removed with a  cold snare. Resected and retrieved.                           - The examination was otherwise normal on direct                            and retroflexion views. Moderate Sedation:      Moderate (conscious) sedation was personally administered by an       anesthesia professional. The following parameters were monitored: oxygen       saturation, heart rate, blood pressure, respiratory rate, EKG, adequacy       of pulmonary ventilation, and response to care. Recommendation:           - Patient has a contact number available for                            emergencies. The signs and symptoms of potential                            delayed  complications were discussed with the                            patient. Return to normal activities tomorrow.                            Written discharge instructions were provided to the                            patient.                           - Resume previous diet.                           - Continue present medications.                           - Repeat colonoscopy date to be determined after                            pending pathology results are reviewed for                            surveillance.                           - Return to GI office (date not yet determined). Procedure Code(s):        --- Professional ---                           [phone], Colonoscopy, flexible; with removal of                            tumor(s), polyp(s), or other lesion(s) by snare  technique Diagnosis Code(s):        --- Professional ---                           Z12.11, Encounter for screening for malignant                            neoplasm of colon                           D12.0, Benign neoplasm of cecum CPT copyright 2022 American Medical Association. All rights reserved. The codes documented in this report are preliminary and upon coder review may  be revised to meet current compliance requirements. Laura Friends. Wilmina Maxham, MD Gennette Pac, MD 11/20/2022 10:10:52 AM This report has been signed electronically. Number of Addenda: 0

## 2022-11-20 NOTE — Discharge Instructions (Signed)
  Colonoscopy Discharge Instructions  Read the instructions outlined below and refer to this sheet in the next few weeks. These discharge instructions provide you with general information on caring for yourself after you leave the hospital. Your doctor may also give you specific instructions. While your treatment has been planned according to the most current medical practices available, unavoidable complications occasionally occur. If you have any problems or questions after discharge, call Dr. Jena Gauss at [phone]. ACTIVITY You may resume your regular activity, but move at a slower pace for the next 24 hours.  Take frequent rest periods for the next 24 hours.  Walking will help get rid of the air and reduce the bloated feeling in your belly (abdomen).  No driving for 24 hours (because of the medicine (anesthesia) used during the test).   Do not sign any important legal documents or operate any machinery for 24 hours (because of the anesthesia used during the test).  NUTRITION Drink plenty of fluids.  You may resume your normal diet as instructed by your doctor.  Begin with a light meal and progress to your normal diet. Heavy or fried foods are harder to digest and may make you feel sick to your stomach (nauseated).  Avoid alcoholic beverages for 24 hours or as instructed.  MEDICATIONS You may resume your normal medications unless your doctor tells you otherwise.  WHAT YOU CAN EXPECT TODAY Some feelings of bloating in the abdomen.  Passage of more gas than usual.  Spotting of blood in your stool or on the toilet paper.  IF YOU HAD POLYPS REMOVED DURING THE COLONOSCOPY: No aspirin products for 7 days or as instructed.  No alcohol for 7 days or as instructed.  Eat a soft diet for the next 24 hours.  FINDING OUT THE RESULTS OF YOUR TEST Not all test results are available during your visit. If your test results are not back during the visit, make an appointment with your caregiver to find out the  results. Do not assume everything is normal if you have not heard from your caregiver or the medical facility. It is important for you to follow up on all of your test results.  SEEK IMMEDIATE MEDICAL ATTENTION IF: You have more than a spotting of blood in your stool.  Your belly is swollen (abdominal distention).  You are nauseated or vomiting.  You have a temperature over 101.  You have abdominal pain or discomfort that is severe or gets worse throughout the day.     2 small polyps removed today  Further recommendations to follow pending review of pathology report  At patient request, I called Duffy Bruce at [phone] findings and recommendations

## 2022-11-20 NOTE — Anesthesia Preprocedure Evaluation (Signed)
Anesthesia Evaluation  Patient identified by MRN, date of birth, ID band Patient awake    Reviewed: Allergy & Precautions, H&P , NPO status , Patient's Chart, lab work & pertinent test results, reviewed documented beta blocker date and time   Airway Mallampati: II  TM Distance: >3 FB Neck ROM: full    Dental no notable dental hx.    Pulmonary neg pulmonary ROS   Pulmonary exam normal breath sounds clear to auscultation       Cardiovascular Exercise Tolerance: Good negative cardio ROS  Rhythm:regular Rate:Normal     Neuro/Psych  Headaches negative neurological ROS  negative psych ROS   GI/Hepatic negative GI ROS, Neg liver ROS,,,  Endo/Other  negative endocrine ROS    Renal/GU negative Renal ROS  negative genitourinary   Musculoskeletal   Abdominal   Peds  Hematology negative hematology ROS (+)   Anesthesia Other Findings   Reproductive/Obstetrics negative OB ROS                             Anesthesia Physical Anesthesia Plan  ASA: 1  Anesthesia Plan: General   Post-op Pain Management:    Induction:   PONV Risk Score and Plan: Propofol infusion  Airway Management Planned:   Additional Equipment:   Intra-op Plan:   Post-operative Plan:   Informed Consent: I have reviewed the patients History and Physical, chart, labs and discussed the procedure including the risks, benefits and alternatives for the proposed anesthesia with the patient or authorized representative who has indicated his/her understanding and acceptance.     Dental Advisory Given  Plan Discussed with: CRNA  Anesthesia Plan Comments:        Anesthesia Quick Evaluation

## 2022-11-20 NOTE — H&P (Signed)
@LOGO @   Primary Care Physician:  Patient, No Pcp Per Primary Gastroenterologist:  Dr. Jena Gauss  Pre-Procedure History & Physical: HPI:  Laura Ward is a 57 y.o. female is here for a screening colonoscopy.   No first-degree relatives with colon cancer no bowel symptoms.  Here for first ever screening examination.  Past Medical History:  Diagnosis Date   Headache(784.0)     History reviewed. No pertinent surgical history.  Prior to Admission medications   Medication Sig Start Date End Date Taking? Authorizing Provider  acetaminophen (TYLENOL) 500 MG tablet Take 1,000 mg by mouth every 6 (six) hours as needed (pain.).   Yes [provider]  cetirizine (ZYRTEC) 10 MG tablet Take 10 mg by mouth daily as needed for allergies.   Yes [provider]  Coenzyme Q10 (COQ-10 PO) Take 1 tablet by mouth every Monday, Wednesday, and Friday at 8 PM.   Yes [provider]  doxylamine, Sleep, (UNISOM) 25 MG tablet Take 12.5 mg by mouth at bedtime as needed for sleep.   Yes [provider]  ezetimibe (ZETIA) 10 MG tablet Take 1 tablet (10 mg total) by mouth daily. Patient taking differently: Take 10 mg by mouth every evening. 06/01/22 05/27/23 Yes Hilty, Lisette Abu, MD  naproxen sodium (ALEVE) 220 MG tablet Take 220 mg by mouth daily as needed (pain).   Yes [provider]  rosuvastatin (CRESTOR) 5 MG tablet Take 1 tablet (5 mg total) by mouth daily. Patient taking differently: Take 5 mg by mouth every Monday, Wednesday, and Friday at 8 PM. 06/01/22 05/27/23 Yes Hilty, Lisette Abu, MD  Sod Picosulfate-Mag Ox-Cit Acd (CLENPIQ) 10-3.5-12 MG-GM -GM/175ML SOLN Take 1 kit by mouth as directed. 06/22/22  Yes Autumnrose Yore, Gerrit Friends, MD    Allergies as of 10/03/2022   (No Known Allergies)    Family History  Problem Relation Age of Onset   Breast cancer Mother 30       TAH-BSO in 17s - benign reasons   Atrial fibrillation Mother    Hyperlipidemia Mother     Pancreatic cancer Maternal Grandmother 47   Colon cancer Maternal Grandfather 48   Arthritis Father    Heart disease Father        SVT   Heart disease Paternal Grandmother        died during heart surgery   Heart disease Paternal Grandfather    Stroke Paternal Grandfather     Social History   Socioeconomic History   Marital status: Married    Spouse name: Not on file   Number of children: Not on file   Years of education: Not on file   Highest education level: Not on file  Occupational History   Not on file  Tobacco Use   Smoking status: Never   Smokeless tobacco: Never  Substance and Sexual Activity   Alcohol use: No   Drug use: No   Sexual activity: Not on file  Other Topics Concern   Not on file  Social History Narrative   ** Merged History Encounter **       Social Determinants of Health   Financial Resource Strain: Not on file  Food Insecurity: Not on file  Transportation Needs: Not on file  Physical Activity: Not on file  Stress: Not on file  Social Connections: Not on file  Intimate Partner Violence: Not on file    Review of Systems: See HPI, otherwise negative ROS  Physical Exam: BP 120/78   Pulse (!) 56  Temp 98.5 F (36.9 C) (Oral)   Resp 15   Ht 5\' 8"  (1.727 m)   Wt 90.7 kg   LMP 01/21/2017 (Approximate)   SpO2 99%   BMI 30.41 kg/m  General:   Alert,  Well-developed, well-nourished, pleasant and cooperative in NAD Neck:  Supple; no masses or thyromegaly. Lungs:  Clear throughout to auscultation.   No wheezes, crackles, or rhonchi. No acute distress. Heart:  Regular rate and rhythm; no murmurs, clicks, rubs,  or gallops. Abdomen:  Soft, nontender and nondistended. No masses, hepatosplenomegaly or hernias noted. Normal bowel sounds, without guarding, and without rebound.    Impression/Plan: Laura Ward is now here to undergo a screening colonoscopy.    Risks, benefits, limitations, imponderables and alternatives regarding  colonoscopy have been reviewed with the patient. Questions have been answered. All parties agreeable.     Notice:  This dictation was prepared with Dragon dictation along with smaller phrase technology. Any transcriptional errors that result from this process are unintentional and may not be corrected upon review.

## 2022-11-20 NOTE — Transfer of Care (Signed)
Immediate Anesthesia Transfer of Care Note  Patient: Laura Ward  Procedure(s) Performed: COLONOSCOPY WITH PROPOFOL POLYPECTOMY INTESTINAL  Patient Location: Endoscopy Unit  Anesthesia Type:General  Level of Consciousness: awake  Airway & Oxygen Therapy: Patient Spontanous Breathing  Post-op Assessment: Report given to RN  Post vital signs: Reviewed and stable  Last Vitals:  Vitals Value Taken Time  BP    Temp    Pulse    Resp    SpO2      Last Pain:  Vitals:   11/20/22 0948  TempSrc:   PainSc: 0-No pain      Patients Stated Pain Goal: 5 (11/20/22 0837)  Complications: No notable events documented.

## 2022-11-22 LAB — SURGICAL PATHOLOGY

## 2022-11-24 ENCOUNTER — Encounter (HOSPITAL_COMMUNITY): Payer: Self-pay | Admitting: Internal Medicine

## 2022-11-25 ENCOUNTER — Encounter: Payer: Self-pay | Admitting: Internal Medicine

## 2022-12-20 ENCOUNTER — Ambulatory Visit
Admission: RE | Admit: 2022-12-20 | Discharge: 2022-12-20 | Disposition: A | Discharge status: Home / Self Care | Payer: Federal, State, Local not specified - PPO | Source: Ambulatory Visit | Attending: Family Medicine | Admitting: Family Medicine

## 2022-12-20 DIAGNOSIS — Z1231 Encounter for screening mammogram for malignant neoplasm of breast: Secondary | ICD-10-CM

## 2022-12-25 ENCOUNTER — Encounter: Payer: Self-pay | Admitting: Family Medicine

## 2022-12-25 ENCOUNTER — Ambulatory Visit: Payer: Federal, State, Local not specified - PPO | Admitting: Family Medicine

## 2022-12-25 VITALS — BP 100/68 | HR 60 | Temp 98.2°F | Ht 68.0 in | Wt 208.4 lb

## 2022-12-25 DIAGNOSIS — E785 Hyperlipidemia, unspecified: Secondary | ICD-10-CM | POA: Diagnosis not present

## 2022-12-25 DIAGNOSIS — Z0000 Encounter for general adult medical examination without abnormal findings: Secondary | ICD-10-CM

## 2022-12-25 LAB — COMPREHENSIVE METABOLIC PANEL
ALT: 13 U/L (ref 0–35)
AST: 20 U/L (ref 0–37)
Albumin: 4.1 g/dL (ref 3.5–5.2)
Alkaline Phosphatase: 52 U/L (ref 39–117)
BUN: 13 mg/dL (ref 6–23)
CO2: 27 mEq/L (ref 19–32)
Calcium: 9 mg/dL (ref 8.4–10.5)
Chloride: 107 mEq/L (ref 96–112)
Creatinine, Ser: 0.84 mg/dL (ref 0.40–1.20)
GFR: 77.98 mL/min (ref >60.00)
Glucose, Bld: 99 mg/dL (ref 70–99)
Potassium: 4 mEq/L (ref 3.5–5.1)
Sodium: 141 mEq/L (ref 135–145)
Total Bilirubin: 0.6 mg/dL (ref 0.2–1.2)
Total Protein: 6.5 g/dL (ref 6.0–8.3)

## 2022-12-25 NOTE — Progress Notes (Signed)
Complete physical exam  Patient: Laura Ward   DOB: [birth year]   57 y.o. Female  MRN: [id number]  Subjective:    Chief Complaint  Patient presents with   Establish Care    Minal Kniss is a 57 y.o. female who presents today for a complete physical exam. She reports consuming a general diet. Exercise is limited by orthopedic condition(s): torn IT band on the right, however she is still trying to exercise daily. She generally feels well. She reports sleeping nighttime awakenings are frequent. She does not have additional problems to discuss today.    Most recent fall risk assessment:     No data to display           Most recent depression screenings:    12/25/2022    8:25 AM 08/05/2021   10:27 AM  PHQ 2/9 Scores  PHQ - 2 Score 0 0  PHQ- 9 Score 1 1    Vision:Not within last year  and Dental: No current dental problems and Receives regular dental care  Patient Active Problem List   Diagnosis Date Noted   Sebaceous cyst of skin of left breast 10/16/2017   Genetic testing 06/19/2016   Family history of breast cancer in female 06/19/2016      Patient Care Team: Karie Georges, MD as PCP - General (Family Medicine) Candice Camp, MD as Consulting Physician (Obstetrics and Gynecology)   Outpatient Medications Prior to Visit  Medication Sig   acetaminophen (TYLENOL) 500 MG tablet Take 1,000 mg by mouth every 6 (six) hours as needed (pain.).   cetirizine (ZYRTEC) 10 MG tablet Take 10 mg by mouth daily as needed for allergies.   Coenzyme Q10 (COQ-10 PO) Take 1 tablet by mouth every Monday, Wednesday, and Friday at 8 PM.   doxylamine, Sleep, (UNISOM) 25 MG tablet Take 12.5 mg by mouth at bedtime as needed for sleep.   ezetimibe (ZETIA) 10 MG tablet Take 1 tablet (10 mg total) by mouth daily. (Patient taking differently: Take 10 mg by mouth every evening.)   naproxen sodium (ALEVE) 220 MG tablet Take 220 mg by mouth daily as needed (pain).    rosuvastatin (CRESTOR) 5 MG tablet Take 1 tablet (5 mg total) by mouth daily. (Patient taking differently: Take 5 mg by mouth every Monday, Wednesday, and Friday at 8 PM.)   Sod Picosulfate-Mag Ox-Cit Acd (CLENPIQ) 10-3.5-12 MG-GM -GM/175ML SOLN Take 1 kit by mouth as directed.   No facility-administered medications prior to visit.    Review of Systems  HENT:  Negative for hearing loss.   Eyes:  Negative for blurred vision.  Respiratory:  Negative for shortness of breath.   Cardiovascular:  Negative for chest pain.  Gastrointestinal: Negative.   Genitourinary: Negative.   Musculoskeletal:  Negative for back pain.  Neurological:  Negative for headaches.  Psychiatric/Behavioral:  Negative for depression.        Objective:     BP 100/68 (BP Location: Left Arm, Patient Position: Sitting, Cuff Size: Large)   Pulse 60   Temp 98.2 F (36.8 C) (Oral)   Ht 5\' 8"  (1.727 m)   Wt 208 lb 6.4 oz (94.5 kg)   LMP 01/21/2017 (Approximate)   SpO2 99%   BMI 31.69 kg/m    Physical Exam Vitals reviewed.  Constitutional:      Appearance: Normal appearance. She is well-groomed and normal weight.  HENT:     Right Ear: Tympanic membrane normal.     Left Ear: Tympanic  membrane normal.     Mouth/Throat:     Mouth: Mucous membranes are moist.     Pharynx: No posterior oropharyngeal erythema.  Eyes:     Conjunctiva/sclera: Conjunctivae normal.  Neck:     Thyroid: No thyromegaly.  Cardiovascular:     Rate and Rhythm: Normal rate and regular rhythm.     Pulses: Normal pulses.     Heart sounds: S1 normal and S2 normal.  Pulmonary:     Effort: Pulmonary effort is normal.     Breath sounds: Normal breath sounds and air entry.  Abdominal:     General: Bowel sounds are normal.  Musculoskeletal:     Right lower leg: No edema.     Left lower leg: No edema.  Lymphadenopathy:     Cervical: No cervical adenopathy.  Neurological:     Mental Status: She is alert and oriented to person, place, and  time. Mental status is at baseline.     Gait: Gait is intact.  Psychiatric:        Mood and Affect: Mood and affect normal.        Speech: Speech normal.        Behavior: Behavior normal.        Judgment: Judgment normal.      No results found for any visits on 12/25/22.       Assessment & Plan:    Routine Health Maintenance and Physical Exam  Immunization History  Administered Date(s) Administered   Influenza-Unspecified 03/24/2014, 05/08/2015, 04/24/2019, 03/24/2021   PFIZER(Purple Top)SARS-COV-2 Vaccination 07/14/2019, 08/01/2019, 03/30/2020, 04/22/2021   Tdap 08/05/2021   Zoster Recombinat (Shingrix) 08/05/2021, 11/21/2021    Health Maintenance  Topic Date Due   PAP SMEAR-Modifier  03/27/2023 (Originally 07/25/2019)   COVID-19 Vaccine (5 - 2023-24 season) 12/25/2023 (Originally 03/24/2022)   Hepatitis C Screening  12/25/2023 (Originally 02/05/1985)   HIV Screening  12/25/2023 (Originally 02/05/1982)   INFLUENZA VACCINE  02/22/2023   MAMMOGRAM  12/19/2024   DTaP/Tdap/Td (2 - Td or Tdap) 08/06/2031   Colonoscopy  11/19/2032   Zoster Vaccines- Shingrix  Completed   HPV VACCINES  Aged Out    Discussed health benefits of physical activity, and encouraged her to engage in regular exercise appropriate for her age and condition.  Hyperlipidemia, unspecified hyperlipidemia type -     Comprehensive metabolic panel  Preventative health care  Normal physical exam findings today, she needs a CMP to check liver function tests due to being on statin medication. She is UTD on her HM except for her pap, she was reminded to schedule this with her GYN. Handouts given on healthy eating and exercise. RTC 1 year.  Return in about 1 year (around 12/25/2023) for annual physical exam.     Karie Georges, MD

## 2022-12-28 DIAGNOSIS — H43812 Vitreous degeneration, left eye: Secondary | ICD-10-CM | POA: Diagnosis not present

## 2023-01-04 LAB — NMR, LIPOPROFILE
Cholesterol, Total: 178 mg/dL (ref 100–199)
HDL Particle Number: 41.2 umol/L (ref >=30.5)
HDL-C: 64 mg/dL (ref >39)
LDL Particle Number: 1206 nmol/L — ABNORMAL HIGH (ref <1000)
LDL Size: 20.9 nm (ref >20.5)
LDL-C (NIH Calc): 103 mg/dL — ABNORMAL HIGH (ref 0–99)
LP-IR Score: <25 (ref <=45)
Small LDL Particle Number: 432 nmol/L (ref <=527)
Triglycerides: 54 mg/dL (ref 0–149)

## 2023-01-04 LAB — TSH: TSH: 2.51 u[IU]/mL (ref 0.450–4.500)

## 2023-01-12 ENCOUNTER — Ambulatory Visit: Payer: Federal, State, Local not specified - PPO | Attending: Internal Medicine | Admitting: Internal Medicine

## 2023-01-12 ENCOUNTER — Encounter: Payer: Self-pay | Admitting: Internal Medicine

## 2023-01-12 VITALS — BP 112/70 | HR 63 | Ht 68.0 in | Wt 207.4 lb

## 2023-01-12 DIAGNOSIS — I480 Paroxysmal atrial fibrillation: Secondary | ICD-10-CM | POA: Diagnosis not present

## 2023-01-12 DIAGNOSIS — E7849 Other hyperlipidemia: Secondary | ICD-10-CM

## 2023-01-12 DIAGNOSIS — E7841 Elevated Lipoprotein(a): Secondary | ICD-10-CM | POA: Diagnosis not present

## 2023-01-12 DIAGNOSIS — M791 Myalgia, unspecified site: Secondary | ICD-10-CM | POA: Diagnosis not present

## 2023-01-12 DIAGNOSIS — T466X5A Adverse effect of antihyperlipidemic and antiarteriosclerotic drugs, initial encounter: Secondary | ICD-10-CM

## 2023-01-12 NOTE — Patient Instructions (Signed)
Medication Instructions:  STOP rosuvastatin (crestor)  Dr. Rennis Golden recommends Repatha Sureclick 140mg /ml (PCSK9). This is an injectable cholesterol medication self-administered once every 14 days. This medication will likely need prior approval with your insurance company, which we will work on. If the medication is not approved initially, we may need to do an appeal with your insurance.   Administer medication in area of fatty tissue such as abdomen, outer thigh, back of upper arm - and rotate site with each injection Store medication in refrigerator until ready to administer - allow to sit at room temp for 30 mins - 1 hour prior to injection Dispose of medication in a SHARPS container - your pharmacy should be able to direct you on this and proper disposal   If you need a co-pay card for Repatha: Lawsponsor.fr If you need a co-pay card for Praluent: [email or web address]  Patient Assistance:    These foundations have funds at various times.   The [employer] Foundation: https://www.panfoundation.org/disease-funds/hypercholesterolemia/ -- can sign up for wait list  The [facility] offers assistance to help pay for medication copays.  They will cover copays for all cholesterol lowering meds, including statins, fibrates, omega-3 fish oils like Vascepa, ezetimibe, Repatha, Praluent, Nexletol, Nexlizet.  The cards are usually good for $2,500 or 12 months, whichever comes first. Our fax # is [phone] (you will need this to apply) Go to healthwellfoundation.org Click on "Apply Now" Answer questions as to whom is applying (patient or representative) Your disease fund will be "hypercholesterolemia - Medicare access" They will ask questions about finances and which medications you are taking for cholesterol When you submit, the approval is usually within minutes.  You will need to print the card information from the site You will need to show this  information to your pharmacy, they will bill your Medicare Part D plan first -then bill [employer] Well --for the copay.   You can also call them at [phone], although the hold times can be quite long.     *If you need a refill on your cardiac medications before your next appointment, please call your pharmacy*   Lab Work: FASTING NMR lipoprofile, LPa in 3-4 months -- complete about a week before next visit  If you have labs (blood work) drawn today and your tests are completely normal, you will receive your results only by: MyChart Message (if you have MyChart) OR A paper copy in the mail If you have any lab test that is abnormal or we need to change your treatment, we will call you to review the results.    Follow-Up: At [facility], you and your health needs are our priority.  As part of our continuing mission to provide you with exceptional heart care, we have created designated Provider Care Teams.  These Care Teams include your primary Cardiologist (physician) and Advanced Practice Providers (APPs -  Physician Assistants and Nurse Practitioners) who all work together to provide you with the care you need, when you need it.  We recommend signing up for the patient portal called "MyChart".  Sign up information is provided on this After Visit Summary.  MyChart is used to connect with patients for Virtual Visits (Telemedicine).  Patients are able to view lab/test results, encounter notes, upcoming appointments, etc.  Non-urgent messages can be sent to your provider as well.   To learn more about what you can do with MyChart, go to [email or web address].    Your next appointment:    3-4 months with Dr. Rennis Golden -  lipid

## 2023-01-12 NOTE — Progress Notes (Signed)
LIPID CLINIC CONSULT NOTE  Chief Complaint:  Palpitations  Primary Care Physician: Karie Georges, MD  Primary Cardiologist:  None  HPI:  Laura Ward is a 57 y.o. female who is being seen today for the evaluation of dyslipidemia. 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  This showed 3 areas of punctate plaque in the proximal to mid and distal RCA with a calcium score of 16.5, 85th percentile for age and sex matched control.  Additionally, she had recent lipid testing which was a lipoprotein particle study.  Her LDL-P was 2155 with an LDL-C of 197, HDL-C81, triglycerides 51 and total cholesterol 285.  Small LDL particle numbers were minimally elevated at 537 and LDL size was larger at 21.4 .  In the past she had been on Lipitor but had potentially some side effects from the medication.  Recently she had worked on weight loss and more healthy lifestyle and actually had lost weight and exercises regularly, but despite this her cholesterol numbers have gone up.  She does not take any medications.  She reports no family history of heart disease in either of her parents although high cholesterol had been noted.  She eats a fairly low-fat diet and recently was on a weight loss dietary plan.  He does not participate in the ketogenic or high fat diet.  06/01/2022  Laura Ward returns today for follow-up.  She had a recent increase in her lipids.  Her LDL particle number is gone up from [phone], LDL-C of 127, HDL-C of 73 and small LDL particle number remains low at 334.  The increase is likely due to her weaning off medications due to concern for side effects.  She had then restarted her statin back in late September but I think it is too early to see the maximal results from this.  She seemed to have worsening muscle and joint aches on the statin but was pleased with the degree of  lipid lowering it provided.  09/15/2022  Laura Ward returns today for follow-up.  She reports she has been trying to take her rosuvastatin more regularly but still is having some muscle soreness with the low-dose 5 mg.  She may take it 3-4 times a week.  She is also taking the Zetia on a daily basis.  She did miss some medications while she was out of town.  Her cholesterol has improved somewhat.  Her LDL particle number is down to 1099 from 1204.  Her LDL-C is 94 down from 127.  Today she also mention that she been having some recent palpitations.  The other day she had an episode that lasted for about 2 minutes while at work.  This was concerning for A-fib.  Heart rate was about 118.  She was able to capture the event on her Apple Watch which I personally reviewed and it does demonstrate atrial fibrillation.  She also has captured some PVCs.  She reports her mother has atrial fibrillation which she developed also in her 31s.  We discussed sleep and the risk of sleep apnea as a possible contributor for this.  She does report some snoring but generally gets restful sleep.  We might need to consider a sleep study.  01/12/2023  Laura Ward is seen today in follow-up.  She reports she has had worsening joint pains and aches and has reduced the frequency of her rosuvastatin.  She is  now maybe only take it once a week.  Her LDL cholesterol has risen as well as expected.  Previously she had tried atorvastatin and had similar myalgias years ago.  It seems that she may be statin intolerant.  She has been able to tolerate ezetimibe.  With regards to her A-fib she says those episodes have been brief and not as frequent recently.  She had a monitor that showed 4 brief episodes lasting less than 12 seconds.  Her CHA2DS2-VASc score is low and therefore she is not anticoagulated.  PMHx:  Past Medical History:  Diagnosis Date   Headache(784.0)     Past Surgical History:  Procedure Laterality Date   COLONOSCOPY WITH  PROPOFOL N/A 11/20/2022   Procedure: COLONOSCOPY WITH PROPOFOL;  Surgeon: Corbin Ade, MD;  [facility];  Service: Endoscopy;  Laterality: N/A;  9:45 AM, ASA 2   POLYPECTOMY  11/20/2022   Procedure: POLYPECTOMY INTESTINAL;  Surgeon: Corbin Ade, MD;  [facility];  Service: Endoscopy;;    FAMHx:  Family History  Problem Relation Age of Onset   Breast cancer Mother 22       TAH-BSO in 42s - benign reasons   Atrial fibrillation Mother    Hyperlipidemia Mother    Pancreatic cancer Maternal Grandmother 78   Colon cancer Maternal Grandfather 15   Arthritis Father    Heart disease Father        SVT   Heart disease Paternal Grandmother        died during heart surgery   Heart disease Paternal Grandfather    Stroke Paternal Grandfather     SOCHx:   reports that she has never smoked. She has never used smokeless tobacco. She reports that she does not drink alcohol and does not use drugs.  ALLERGIES:  No Known Allergies  ROS: Pertinent items noted in HPI and remainder of comprehensive ROS otherwise negative.  HOME MEDS: Current Outpatient Medications on File Prior to Visit  Medication Sig Dispense Refill   acetaminophen (TYLENOL) 500 MG tablet Take 1,000 mg by mouth every 6 (six) hours as needed (pain.).     cetirizine (ZYRTEC) 10 MG tablet Take 10 mg by mouth daily as needed for allergies.     Coenzyme Q10 (COQ-10 PO) Take 1 tablet by mouth every Monday, Wednesday, and Friday at 8 PM.     doxylamine, Sleep, (UNISOM) 25 MG tablet Take 12.5 mg by mouth at bedtime as needed for sleep.     ezetimibe (ZETIA) 10 MG tablet Take 1 tablet (10 mg total) by mouth daily. (Patient taking differently: Take 10 mg by mouth every evening.) 90 tablet 3   naproxen sodium (ALEVE) 220 MG tablet Take 220 mg by mouth daily as needed (pain).     rosuvastatin (CRESTOR) 5 MG tablet Take 1 tablet (5 mg total) by mouth daily. (Patient taking differently: Take 5 mg by mouth every  Monday, Wednesday, and Friday at 8 PM.) 90 tablet 3   Sod Picosulfate-Mag Ox-Cit Acd (CLENPIQ) 10-3.5-12 MG-GM -GM/175ML SOLN Take 1 kit by mouth as directed. 350 mL 0   No current facility-administered medications on file prior to visit.    LABS/IMAGING: No results found for this or any previous visit (from the past 48 hour(s)). No results found.  LIPID PANEL:    Component Value Date/Time   CHOL 275 (H) 06/23/2019 0951   TRIG 98.0 06/23/2019 0951   HDL 58.60 06/23/2019 0951   CHOLHDL 5 06/23/2019 0951  VLDL 19.6 06/23/2019 0951   LDLCALC 197 (H) 06/23/2019 0951    WEIGHTS: Wt Readings from Last 3 Encounters:  01/12/23 207 lb 6.4 oz (94.1 kg)  12/25/22 208 lb 6.4 oz (94.5 kg)  11/20/22 200 lb (90.7 kg)    VITALS: BP 112/70 (BP Location: Left Arm, Patient Position: Sitting, Cuff Size: Normal)   Pulse 63   Ht 5\' 8"  (1.727 m)   Wt 207 lb 6.4 oz (94.1 kg)   LMP 01/21/2017 (Approximate)   SpO2 95%   BMI 31.54 kg/m   EXAM: Deferred  EKG: Deferred  ASSESSMENT: Paroxysmal atrial fibrillation-CHA2DS2-VASc score of 1 (female) Familial hyperlipidemia, 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   Laura Ward has had intolerance to low-dose rosuvastatin 5 mg only 3 times a week which caused her myalgias as well as of atorvastatin in the past.  She was noted to have a minimally elevated LP(a) as well.  I think she would benefit from a PCSK9 inhibitor.  She is already on ezetimibe.  She had a calcium score 16.5 which was 85th percentile suggesting significant age advanced coronary disease as she was found to have 2 genetic variants of unknown significance of the LDL receptor and APO E protein.  LDL was greater than 190 suggestive  of a familial hyperlipidemia what it was untreated a year ago.  Her target LDL is less than 70.  Will reach out for prior authorization for PCSK9 inhibitor therapy and continue on ezetimibe.  If she has any further A-fib that is picked up symptomatically her with her Apple Watch she should contact me and I may refer her to cardiac EP.  Chrystie Nose, MD, [facility], FACP  [place]  [facility]  Medical Director of the Advanced Lipid Disorders &  Cardiovascular Risk Reduction Clinic Diplomate of the American Board of Clinical Lipidology Attending Cardiologist  Direct Dial: [phone]  Fax: [phone]  Website:  [email or web address] 01/12/2023, 8:13 AM

## 2023-01-15 ENCOUNTER — Other Ambulatory Visit (HOSPITAL_COMMUNITY): Payer: Self-pay

## 2023-01-15 ENCOUNTER — Telehealth: Payer: Self-pay

## 2023-01-15 NOTE — Telephone Encounter (Signed)
Pharmacy Patient Advocate Encounter   Received notification from RN that prior authorization for REPATHA is required/requested.   PA submitted to [facility] via CoverMyMeds Key or (Medicaid) confirmation # [id number]   Status is pending

## 2023-01-18 MED ORDER — REPATHA SURECLICK 140 MG/ML ~~LOC~~ SOAJ: 140.0000 mg | SUBCUTANEOUS | 3 refills | Status: DC

## 2023-01-18 NOTE — Telephone Encounter (Signed)
Pharmacy Patient Advocate Encounter  Prior Authorization for REPATHA has been APPROVED by [facility] from 5.26.24 to 6.25.25.

## 2023-01-18 NOTE — Telephone Encounter (Signed)
Update sent to patient in MyChart 

## 2023-03-13 DIAGNOSIS — Q149 Congenital malformation of posterior segment of eye, unspecified: Secondary | ICD-10-CM | POA: Diagnosis not present

## 2023-03-13 DIAGNOSIS — H524 Presbyopia: Secondary | ICD-10-CM | POA: Diagnosis not present

## 2023-06-24 ENCOUNTER — Other Ambulatory Visit: Payer: Self-pay | Admitting: Internal Medicine

## 2023-07-27 DIAGNOSIS — E7841 Elevated Lipoprotein(a): Secondary | ICD-10-CM | POA: Diagnosis not present

## 2023-07-27 DIAGNOSIS — E7849 Other hyperlipidemia: Secondary | ICD-10-CM | POA: Diagnosis not present

## 2023-07-28 LAB — NMR, LIPOPROFILE
Cholesterol, Total: 160 mg/dL (ref 100–199)
HDL Particle Number: 45.8 umol/L (ref >=30.5)
HDL-C: 72 mg/dL (ref >39)
LDL Particle Number: 814 nmol/L (ref <1000)
LDL Size: 21.5 nmol (ref >20.5)
LDL-C (NIH Calc): 76 mg/dL (ref 0–99)
LP-IR Score: 30 (ref <=45)
Small LDL Particle Number: 383 nmol/L (ref <=527)
Triglycerides: 58 mg/dL (ref 0–149)

## 2023-07-28 LAB — LIPOPROTEIN A (LPA): Lipoprotein (a): 49.5 nmol/L (ref <75.0)

## 2023-08-01 ENCOUNTER — Ambulatory Visit (HOSPITAL_BASED_OUTPATIENT_CLINIC_OR_DEPARTMENT_OTHER): Payer: Federal, State, Local not specified - PPO | Admitting: Internal Medicine

## 2023-08-01 VITALS — BP 104/72 | HR 66 | Ht 68.0 in | Wt 203.6 lb

## 2023-08-01 DIAGNOSIS — E7841 Elevated Lipoprotein(a): Secondary | ICD-10-CM | POA: Diagnosis not present

## 2023-08-01 DIAGNOSIS — E7849 Other hyperlipidemia: Secondary | ICD-10-CM | POA: Diagnosis not present

## 2023-08-01 MED ORDER — REPATHA SURECLICK 140 MG/ML ~~LOC~~ SOAJ: 140.0000 mg | SUBCUTANEOUS | 3 refills | Status: DC

## 2023-08-01 NOTE — Progress Notes (Signed)
 LIPID CLINIC CONSULT NOTE  Chief Complaint:  Follow-up  Primary Care Physician: [name], MD  Primary Cardiologist:  None  HPI:  Laura Ward is a 58 y.o. female who is being seen today for the evaluation of dyslipidemia. 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  This showed 3 areas of punctate plaque in the proximal to mid and distal RCA with a calcium  score of 16.5, 85th percentile for age and sex matched control.  Additionally, she had recent lipid testing which was a lipoprotein particle study.  Her LDL-P was 2155 with an LDL-C of 197, HDL-C81, triglycerides 51 and total cholesterol 285.  Small LDL particle numbers were minimally elevated at 537 and LDL size was larger at 21.4 .  In the past she had been on Lipitor but had potentially some side effects from the medication.  Recently she had worked on weight loss and more healthy lifestyle and actually had lost weight and exercises regularly, but despite this her cholesterol numbers have gone up.  She does not take any medications.  She reports no family history of heart disease in either of her parents although high cholesterol had been noted.  She eats a fairly low-fat diet and recently was on a weight loss dietary plan.  He does not participate in the ketogenic or high fat diet.  06/01/2022  Laura Ward returns today for follow-up.  She had a recent increase in her lipids.  Her LDL particle number is gone up from [phone], LDL-C of 127, HDL-C of 73 and small LDL particle number remains low at 334.  The increase is likely due to her weaning off medications due to concern for side effects.  She had then restarted her statin back in late September but I think it is too early to see the maximal results from this.  She seemed to have worsening muscle and joint aches on the statin but was pleased with the degree of lipid  lowering it provided.  09/15/2022  Laura Ward returns today for follow-up.  She reports she has been trying to take her rosuvastatin  more regularly but still is having some muscle soreness with the low-dose 5 mg.  She may take it 3-4 times a week.  She is also taking the Zetia  on a daily basis.  She did miss some medications while she was out of town.  Her cholesterol has improved somewhat.  Her LDL particle number is down to 1099 from 1204.  Her LDL-C is 94 down from 127.  Today she also mention that she been having some recent palpitations.  The other day she had an episode that lasted for about 2 minutes while at work.  This was concerning for A-fib.  Heart rate was about 118.  She was able to capture the event on her Apple Watch which I personally reviewed and it does demonstrate atrial fibrillation.  She also has captured some PVCs.  She reports her mother has atrial fibrillation which she developed also in her 15s.  We discussed sleep and the risk of sleep apnea as a possible contributor for this.  She does report some snoring but generally gets restful sleep.  We might need to consider a sleep study.  01/12/2023  Laura Ward is seen today in follow-up.  She reports she has had worsening joint pains and aches and has reduced the frequency of her rosuvastatin .  She is now  maybe only take it once a week.  Her LDL cholesterol has risen as well as expected.  Previously she had tried atorvastatin  and had similar myalgias years ago.  It seems that she may be statin intolerant.  She has been able to tolerate ezetimibe .  With regards to her A-fib she says those episodes have been brief and not as frequent recently.  She had a monitor that showed 4 brief episodes lasting less than 12 seconds.  Her CHA2DS2-VASc score is low and therefore she is not anticoagulated.  08/01/2023  Laura Ward is seen today in follow-up.  She had stopped her low-dose statin because of some muscle aches.  She reports generally this is  almost totally resolved.  She is doing well with Repatha  and is tolerating it well.  She has had marked improvement in her lipids.  LDL particle number is now 814, down from 1206.  LDL 76 (down from 103), HDL 58 and triglycerides 72.  Small LDL particle #383.  Her LP(a) is also come down to 49.5 from 81 nmol/L.   PMHx:  Past Medical History:  Diagnosis Date   Headache(784.0)     Past Surgical History:  Procedure Laterality Date   COLONOSCOPY WITH PROPOFOL  N/A 11/20/2022   Procedure: COLONOSCOPY WITH PROPOFOL ;  Surgeon: Shaaron Lamar HERO, MD;  [facility];  Service: Endoscopy;  Laterality: N/A;  9:45 AM, ASA 2   POLYPECTOMY  11/20/2022   Procedure: POLYPECTOMY INTESTINAL;  Surgeon: Shaaron Lamar HERO, MD;  [facility];  Service: Endoscopy;;    FAMHx:  Family History  Problem Relation Age of Onset   Breast cancer Mother 40       TAH-BSO in 70s - benign reasons   Atrial fibrillation Mother    Hyperlipidemia Mother    Pancreatic cancer Maternal Grandmother 88   Colon cancer Maternal Grandfather 56   Arthritis Father    Heart disease Father        SVT   Heart disease Paternal Grandmother        died during heart surgery   Heart disease Paternal Grandfather    Stroke Paternal Grandfather     SOCHx:   reports that she has never smoked. She has never used smokeless tobacco. She reports that she does not drink alcohol and does not use drugs.  ALLERGIES:  Allergies  Allergen Reactions   Atorvastatin  Other (See Comments)    myalgias   Crestor  [Rosuvastatin ] Other (See Comments)    myalgias    ROS: Pertinent items noted in HPI and remainder of comprehensive ROS otherwise negative.  HOME MEDS: Current Outpatient Medications on File Prior to Visit  Medication Sig Dispense Refill   acetaminophen (TYLENOL) 500 MG tablet Take 1,000 mg by mouth every 6 (six) hours as needed (pain.).     Coenzyme Q10 (COQ-10 PO) Take 1 tablet by mouth every Monday, Wednesday, and  Friday at 8 PM.     doxylamine, Sleep, (UNISOM) 25 MG tablet Take 12.5 mg by mouth at bedtime as needed for sleep.     ezetimibe  (ZETIA ) 10 MG tablet TAKE 1 TABLET BY MOUTH EVERY DAY 90 tablet 3   Fexofenadine HCl (ALLEGRA PO) Take by mouth daily.     naproxen sodium (ALEVE) 220 MG tablet Take 220 mg by mouth daily as needed (pain).     Sod Picosulfate-Mag Ox-Cit Acd (CLENPIQ ) 10-3.5-12 MG-GM -GM/175ML SOLN Take 1 kit by mouth as directed. 350 mL 0   No current facility-administered medications on  file prior to visit.    LABS/IMAGING: No results found for this or any previous visit (from the past 48 hours). No results found.  LIPID PANEL:    Component Value Date/Time   CHOL 275 (H) 06/23/2019 0951   TRIG 98.0 06/23/2019 0951   HDL 58.60 06/23/2019 0951   CHOLHDL 5 06/23/2019 0951   VLDL 19.6 06/23/2019 0951   LDLCALC 197 (H) 06/23/2019 0951    WEIGHTS: Wt Readings from Last 3 Encounters:  08/01/23 203 lb 9.6 oz (92.4 kg)  01/12/23 207 lb 6.4 oz (94.1 kg)  12/25/22 208 lb 6.4 oz (94.5 kg)    VITALS: BP 104/72 (Cuff Size: Normal)   Pulse 66   Ht 5' 8 (1.727 m)   Wt 203 lb 9.6 oz (92.4 kg)   LMP 01/21/2017 (Approximate)   BMI 30.96 kg/m   EXAM: Deferred  EKG: Deferred  ASSESSMENT: Paroxysmal atrial fibrillation-CHA2DS2-VASc score of 1 (female) Familial hyperlipidemia, 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   Laura Ward has had marked improvement in her lipids on Repatha  and ezetimibe .  She stopped the statin because of side effects and had improvement in those symptoms.  Her LP(a) now has come down to 49.5 nmol/L.  Particle numbers and LDL are now well treated.  He will continue with his current therapy.  She  feels that her numbers could improve even further with improved diet and exercise during the new year.  Will plan repeat lipids in 6 months and otherwise follow-up with her annually or sooner as necessary.  Vinie KYM Maxcy, MD, [facility], FACP  [place]  [facility]  Medical Director of the Advanced Lipid Disorders &  Cardiovascular Risk Reduction Clinic Diplomate of the American Board of Clinical Lipidology Attending Cardiologist  Direct Dial: [phone]  Fax: [phone]  Website:  [email or web address] Vinie JAYSON Maxcy 08/01/2023, 8:57 AM

## 2023-08-01 NOTE — Patient Instructions (Signed)
 Medication Instructions:  NO CHANGES  *If you need a refill on your cardiac medications before your next appointment, please call your pharmacy*   Lab Work: FASTING NMR lipoprofile in 6 months  If you have labs (blood work) drawn today and your tests are completely normal, you will receive your results only by: MyChart Message (if you have MyChart) OR A paper copy in the mail If you have any lab test that is abnormal or we need to change your treatment, we will call you to review the results.   Testing/Procedures: NONE   Follow-Up: At [facility], you and your health needs are our priority.  As part of our continuing mission to provide you with exceptional heart care, we have created designated Provider Care Teams.  These Care Teams include your primary Cardiologist (physician) and Advanced Practice Providers (APPs -  Physician Assistants and Nurse Practitioners) who all work together to provide you with the care you need, when you need it.  We recommend signing up for the patient portal called MyChart.  Sign up information is provided on this After Visit Summary.  MyChart is used to connect with patients for Virtual Visits (Telemedicine).  Patients are able to view lab/test results, encounter notes, upcoming appointments, etc.  Non-urgent messages can be sent to your provider as well.   To learn more about what you can do with MyChart, go to [email or web address].    Your next appointment:   12 months with Dr. Mona

## 2023-08-07 DIAGNOSIS — Z01419 Encounter for gynecological examination (general) (routine) without abnormal findings: Secondary | ICD-10-CM | POA: Diagnosis not present

## 2023-08-07 DIAGNOSIS — Z124 Encounter for screening for malignant neoplasm of cervix: Secondary | ICD-10-CM | POA: Diagnosis not present

## 2023-08-07 DIAGNOSIS — Z1151 Encounter for screening for human papillomavirus (HPV): Secondary | ICD-10-CM | POA: Diagnosis not present

## 2023-08-07 LAB — HM PAP SMEAR: HM Pap smear: NEGATIVE

## 2023-08-21 ENCOUNTER — Other Ambulatory Visit: Payer: Self-pay | Admitting: Pharmacist Clinician (PhC)/ Clinical Pharmacy Specialist

## 2023-08-21 MED ORDER — REPATHA SURECLICK 140 MG/ML ~~LOC~~ SOAJ: 140.0000 mg | SUBCUTANEOUS | 3 refills | Status: DC

## 2023-08-24 ENCOUNTER — Encounter (HOSPITAL_BASED_OUTPATIENT_CLINIC_OR_DEPARTMENT_OTHER): Payer: Self-pay | Admitting: Internal Medicine

## 2023-08-24 ENCOUNTER — Other Ambulatory Visit: Payer: Self-pay

## 2023-08-24 MED ORDER — REPATHA SURECLICK 140 MG/ML ~~LOC~~ SOAJ: 140.0000 mg | SUBCUTANEOUS | 3 refills | Status: DC

## 2023-12-26 ENCOUNTER — Ambulatory Visit (INDEPENDENT_AMBULATORY_CARE_PROVIDER_SITE_OTHER): Payer: Federal, State, Local not specified - PPO | Admitting: Family Medicine

## 2023-12-26 ENCOUNTER — Encounter: Payer: Self-pay | Admitting: Family Medicine

## 2023-12-26 VITALS — BP 100/62 | HR 60 | Temp 98.2°F | Ht 69.0 in | Wt 207.8 lb

## 2023-12-26 DIAGNOSIS — Z0000 Encounter for general adult medical examination without abnormal findings: Secondary | ICD-10-CM

## 2023-12-26 LAB — COMPREHENSIVE METABOLIC PANEL WITH GFR
ALT: 14 U/L (ref 0–35)
AST: 19 U/L (ref 0–37)
Albumin: 4.3 g/dL (ref 3.5–5.2)
Alkaline Phosphatase: 62 U/L (ref 39–117)
BUN: 14 mg/dL (ref 6–23)
CO2: 27 meq/L (ref 19–32)
Calcium: 9.3 mg/dL (ref 8.4–10.5)
Chloride: 106 meq/L (ref 96–112)
Creatinine, Ser: 0.86 mg/dL (ref 0.40–1.20)
GFR: 75.27 mL/min (ref >60.00)
Glucose, Bld: 101 mg/dL — ABNORMAL HIGH (ref 70–99)
Potassium: 4.1 meq/L (ref 3.5–5.1)
Sodium: 140 meq/L (ref 135–145)
Total Bilirubin: 0.6 mg/dL (ref 0.2–1.2)
Total Protein: 6.9 g/dL (ref 6.0–8.3)

## 2023-12-26 LAB — CBC WITH DIFFERENTIAL/PLATELET
Basophils Absolute: 0 10*3/uL (ref 0.0–0.1)
Basophils Relative: 0.9 % (ref 0.0–3.0)
Eosinophils Absolute: 0.1 10*3/uL (ref 0.0–0.7)
Eosinophils Relative: 3 % (ref 0.0–5.0)
HCT: 41.1 % (ref 36.0–46.0)
Hemoglobin: 13.7 g/dL (ref 12.0–15.0)
Lymphocytes Relative: 30 % (ref 12.0–46.0)
Lymphs Abs: 1 10*3/uL (ref 0.7–4.0)
MCHC: 33.2 g/dL (ref 30.0–36.0)
MCV: 87.8 fl (ref 78.0–100.0)
Monocytes Absolute: 0.3 10*3/uL (ref 0.1–1.0)
Monocytes Relative: 9.8 % (ref 3.0–12.0)
Neutro Abs: 1.8 10*3/uL (ref 1.4–7.7)
Neutrophils Relative %: 56.3 % (ref 43.0–77.0)
Platelets: 208 10*3/uL (ref 150.0–400.0)
RBC: 4.68 Mil/uL (ref 3.87–5.11)
RDW: 13 % (ref 11.5–15.5)
WBC: 3.2 10*3/uL — ABNORMAL LOW (ref 4.0–10.5)

## 2023-12-26 NOTE — Progress Notes (Signed)
 Complete physical exam  Patient: Laura Ward   DOB: [birth year]   58 y.o. Female  MRN: [id number]  Subjective:     Chief Complaint  Patient presents with   Annual Exam    Laura Ward is a 58 y.o. female who presents today for a complete physical exam. She reports consuming a general diet. Gym/ health club routine includes cardio and light weights. She generally feels well. She reports sleeping fairly well, still having nighttime awakenings occasionally. She does not have additional problems to discuss today.    Most recent fall risk assessment:     No data to display           Most recent depression screenings:    12/26/2023    7:58 AM 12/25/2022    8:25 AM  PHQ 2/9 Scores  PHQ - 2 Score 0 0  PHQ- 9 Score 2 1    Vision:Within last year and Dr. Alfornia Imam and Dental: No current dental problems and Receives regular dental care  Patient Active Problem List   Diagnosis Date Noted   Sebaceous cyst of skin of left breast 10/16/2017   Genetic testing 06/19/2016   Family history of breast cancer in female 06/19/2016      Patient Care Team: Aida House, MD as PCP - General (Family Medicine) Belle Box, MD as Consulting Physician (Obstetrics and Gynecology)   Outpatient Medications Prior to Visit  Medication Sig   acetaminophen (TYLENOL) 500 MG tablet Take 1,000 mg by mouth every 6 (six) hours as needed (pain.).   Coenzyme Q10 (COQ-10 PO) Take 1 tablet by mouth every Monday, Wednesday, and Friday at 8 PM.   doxylamine, Sleep, (UNISOM) 25 MG tablet Take 12.5 mg by mouth at bedtime as needed for sleep.   Evolocumab  (REPATHA  SURECLICK) 140 MG/ML SOAJ Inject 140 mg into the skin every 14 (fourteen) days.   ezetimibe  (ZETIA ) 10 MG tablet TAKE 1 TABLET BY MOUTH EVERY DAY   Fexofenadine HCl (ALLEGRA PO) Take by mouth daily.   naproxen sodium (ALEVE) 220 MG tablet Take 220 mg by mouth daily as needed (pain).   Sod Picosulfate-Mag Ox-Cit Acd (CLENPIQ )  10-3.5-12 MG-GM -GM/175ML SOLN Take 1 kit by mouth as directed.   No facility-administered medications prior to visit.    Review of Systems  HENT:  Negative for hearing loss.   Eyes:  Negative for blurred vision.  Respiratory:  Negative for shortness of breath.   Cardiovascular:  Negative for chest pain.  Gastrointestinal: Negative.   Genitourinary: Negative.   Musculoskeletal:  Negative for back pain.  Neurological:  Negative for headaches.  Psychiatric/Behavioral:  Negative for depression.        Objective:     BP 100/62   Pulse 60   Temp 98.2 F (36.8 C) (Oral)   Ht 5\' 9"  (1.753 m)   Wt 207 lb 12.8 oz (94.3 kg)   LMP 01/21/2017 (Approximate)   SpO2 98%   BMI 30.69 kg/m    Physical Exam Vitals reviewed.  Constitutional:      Appearance: Normal appearance. She is well-groomed and normal weight.  HENT:     Right Ear: Tympanic membrane and ear canal normal.     Left Ear: Tympanic membrane and ear canal normal.     Mouth/Throat:     Mouth: Mucous membranes are moist.     Pharynx: No posterior oropharyngeal erythema.  Eyes:     Conjunctiva/sclera: Conjunctivae normal.  Neck:     Thyroid : No  thyromegaly.  Cardiovascular:     Rate and Rhythm: Normal rate and regular rhythm.     Pulses: Normal pulses.     Heart sounds: S1 normal and S2 normal.  Pulmonary:     Effort: Pulmonary effort is normal.     Breath sounds: Normal breath sounds and air entry.  Abdominal:     General: Abdomen is flat. Bowel sounds are normal.     Palpations: Abdomen is soft.  Musculoskeletal:     Right lower leg: No edema.     Left lower leg: No edema.  Lymphadenopathy:     Cervical: No cervical adenopathy.  Neurological:     Mental Status: She is alert and oriented to person, place, and time. Mental status is at baseline.     Gait: Gait is intact.  Psychiatric:        Mood and Affect: Mood and affect normal.        Speech: Speech normal.        Behavior: Behavior normal.         Judgment: Judgment normal.      No results found for any visits on 12/26/23.     Assessment & Plan:    Routine Health Maintenance and Physical Exam  Immunization History  Administered Date(s) Administered   Influenza-Unspecified 03/24/2014, 05/08/2015, 04/24/2019, 03/24/2021   PFIZER(Purple Top)SARS-COV-2 Vaccination 07/14/2019, 08/01/2019, 03/30/2020, 04/22/2021   Tdap 08/05/2021   Zoster Recombinant(Shingrix ) 08/05/2021, 11/21/2021    Health Maintenance  Topic Date Due   Cervical Cancer Screening (HPV/Pap Cotest)  Never done   COVID-19 Vaccine (5 - 2024-25 season) 03/25/2023   Hepatitis C Screening  12/25/2024 (Originally 02/05/1984)   HIV Screening  12/25/2024 (Originally [birth year]/1982)   INFLUENZA VACCINE  02/22/2024   MAMMOGRAM  12/19/2024   DTaP/Tdap/Td (2 - Td or Tdap) 08/06/2031   Colonoscopy  11/19/2032   Zoster Vaccines- Shingrix   Completed   HPV VACCINES  Aged Out   Meningococcal B Vaccine  Aged Out    Discussed health benefits of physical activity, and encouraged her to engage in regular exercise appropriate for her age and condition.  Routine general medical examination at a health care facility -     Comprehensive metabolic panel with GFR; Future -     CBC with Differential/Platelet; Future  Normal physical exam findings today. I counseled the patient on healthy sleep habits, use of OTC sleep aids, etc. Handouts given. HM reviewed, she is UTD on this and her vaccinations.   Return in about 1 year (around 12/25/2024) for annual physical exam.     Aida House, MD

## 2023-12-26 NOTE — Patient Instructions (Signed)

## 2023-12-31 ENCOUNTER — Ambulatory Visit: Payer: Self-pay | Admitting: Family Medicine

## 2024-01-08 ENCOUNTER — Other Ambulatory Visit: Payer: Self-pay | Admitting: Internal Medicine

## 2024-01-08 ENCOUNTER — Other Ambulatory Visit (HOSPITAL_COMMUNITY): Payer: Self-pay

## 2024-01-08 ENCOUNTER — Telehealth: Payer: Self-pay | Admitting: Pharmacy Technician

## 2024-01-08 NOTE — Telephone Encounter (Signed)
 Pharmacy Patient Advocate Encounter   Received notification from Fax that prior authorization for Repatha  is required/requested.   Insurance verification completed.   The patient is insured through [employer] .   Per test claim: PA required; PA submitted to above mentioned insurance via CoverMyMeds Key/confirmation #/EOC  [id number] Status is pending

## 2024-01-09 ENCOUNTER — Other Ambulatory Visit (HOSPITAL_COMMUNITY): Payer: Self-pay

## 2024-01-09 NOTE — Telephone Encounter (Signed)
 Pharmacy Patient Advocate Encounter  Received notification from [facility] that Prior Authorization for repatha  has been APPROVED from 12/09/23 to 01/07/25. Ran test claim, Copay is $245.03- 3 months. This test claim was processed through [facility]- copay amounts may vary at other pharmacies due to pharmacy/plan contracts, or as the patient moves through the different stages of their insurance plan.   PA #/Case ID/Reference #: [id number]

## 2024-02-13 ENCOUNTER — Ambulatory Visit: Payer: Self-pay | Admitting: Family Medicine

## 2024-02-13 ENCOUNTER — Ambulatory Visit: Payer: Self-pay

## 2024-02-13 ENCOUNTER — Ambulatory Visit (INDEPENDENT_AMBULATORY_CARE_PROVIDER_SITE_OTHER): Admitting: Family Medicine

## 2024-02-13 ENCOUNTER — Ambulatory Visit
Admission: RE | Admit: 2024-02-13 | Discharge: 2024-02-13 | Disposition: A | Discharge status: Home / Self Care | Source: Ambulatory Visit | Attending: Family Medicine | Admitting: Family Medicine

## 2024-02-13 VITALS — BP 120/70 | Ht 68.0 in | Wt 202.0 lb

## 2024-02-13 DIAGNOSIS — M25561 Pain in right knee: Secondary | ICD-10-CM

## 2024-02-13 DIAGNOSIS — S82001A Unspecified fracture of right patella, initial encounter for closed fracture: Secondary | ICD-10-CM | POA: Diagnosis not present

## 2024-02-13 NOTE — Progress Notes (Addendum)
 PCP: Laura [name] HERO, MD  Subjective:   HPI: Patient is a 58 y.o. female here for right knee pain after fall 3 days ago.  Patient fell while walking with her husband 3 days ago. She landed on her right knee and elbow. Elbow has improved but knee pain persists.  She had swelling of her right knee and some pain with walking since then. No fevers. She has been taking some NSAIDs for the pain and using ice which has helped her.   Past Medical History:  Diagnosis Date   Headache(784.0)     Current Outpatient Medications on File Prior to Visit  Medication Sig Dispense Refill   acetaminophen (TYLENOL) 500 MG tablet Take 1,000 mg by mouth every 6 (six) hours as needed (pain.).     Coenzyme Q10 (COQ-10 PO) Take 1 tablet by mouth every Monday, Wednesday, and Friday at 8 PM.     doxylamine, Sleep, (UNISOM) 25 MG tablet Take 12.5 mg by mouth at bedtime as needed for sleep.     Evolocumab  (REPATHA  SURECLICK) 140 MG/ML SOAJ INJECT 140 MG INTO THE SKIN EVERY 14 (FOURTEEN) DAYS. 6 mL 3   ezetimibe  (ZETIA ) 10 MG tablet TAKE 1 TABLET BY MOUTH EVERY DAY 90 tablet 3   Fexofenadine HCl (ALLEGRA PO) Take by mouth daily.     naproxen sodium (ALEVE) 220 MG tablet Take 220 mg by mouth daily as needed (pain).     Sod Picosulfate-Mag Ox-Cit Acd (CLENPIQ ) 10-3.5-12 MG-GM -GM/175ML SOLN Take 1 kit by mouth as directed. 350 mL 0   No current facility-administered medications on file prior to visit.    Past Surgical History:  Procedure Laterality Date   COLONOSCOPY WITH PROPOFOL  N/A 11/20/2022   Procedure: COLONOSCOPY WITH PROPOFOL ;  Surgeon: Shaaron Lamar HERO, MD;  [facility];  Service: Endoscopy;  Laterality: N/A;  9:45 AM, ASA 2   POLYPECTOMY  11/20/2022   Procedure: POLYPECTOMY INTESTINAL;  Surgeon: Shaaron Lamar HERO, MD;  Location: [facility];  Service: Endoscopy;;    Allergies  Allergen Reactions   Atorvastatin  Other (See Comments)    myalgias   Crestor  [Rosuvastatin ] Other (See Comments)     myalgias    BP 120/70   Ht 5' 8 (1.727 m)   Wt 202 lb (91.6 kg)   LMP 01/21/2017 (Approximate)   BMI 30.71 kg/m       No data to display              No data to display              Objective:  Physical Exam:  Gen: NAD, comfortable in exam room  MSK:  Right Knee  Superficial abrasion on right knee, clean, edematous compared to the left.  Mildly tender to palpation of the patella, no tenderness to palpation of the quadriceps tendon, patellar tendon, tibia, joint lines. FROM with normal strength. Negative ant/post drawers. Negative valgus/varus testing. Negative lachman.  Negative mcmurrays, apleys.  NV intact distally.  02/13/2024 DG Knee AP/LAT W/Sunrise Right  Subtle patellar fracture. Joint effusion. Minimal degenerative changes.     Assessment & Plan:   Assessment & Plan Acute pain of right knee Patient has evidence of mild patellar fracture with joint effusion. Would benefit from knee immobilizer.  - Patient to return to clinic for knee immobilizer  - Plan to follow up and repeat xrays in 2 weeks  - NSAIDs and ice as needed

## 2024-02-26 ENCOUNTER — Ambulatory Visit
Admission: RE | Admit: 2024-02-26 | Discharge: 2024-02-26 | Disposition: A | Discharge status: Home / Self Care | Source: Ambulatory Visit | Attending: Family Medicine | Admitting: Family Medicine

## 2024-02-26 DIAGNOSIS — S82041A Displaced comminuted fracture of right patella, initial encounter for closed fracture: Secondary | ICD-10-CM | POA: Diagnosis not present

## 2024-02-26 DIAGNOSIS — M25561 Pain in right knee: Secondary | ICD-10-CM

## 2024-02-27 ENCOUNTER — Ambulatory Visit: Admitting: Family Medicine

## 2024-02-27 VITALS — BP 126/82 | Ht 68.0 in | Wt 205.0 lb

## 2024-02-27 DIAGNOSIS — M25561 Pain in right knee: Secondary | ICD-10-CM | POA: Diagnosis not present

## 2024-02-27 NOTE — Progress Notes (Unsigned)
 PCP: [name], MD  Subjective:   HPI: Patient is a 58 y.o. female here for follow-up regarding a vertical patellar fracture, last seen in clinic on 02/13/2024.  Patient reports that her knee continues to improve and that she has been wearing the immobilizer for the past 2 weeks. She is able to walk with the brace without pain and believes that her right knee is slightly swollen. She has been taking Ibuprofen as needed.   She also states that over the past week, she has been experiencing increased discomfort in her left elbow/forearm. She says the pain comes and goes and that her forearm feels tight/tense at times. She also has occasional sharp pain in the area where she fell on her elbow.   Past Medical History:  Diagnosis Date   Headache(784.0)     Current Outpatient Medications on File Prior to Visit  Medication Sig Dispense Refill   acetaminophen (TYLENOL) 500 MG tablet Take 1,000 mg by mouth every 6 (six) hours as needed (pain.).     Coenzyme Q10 (COQ-10 PO) Take 1 tablet by mouth every Monday, Wednesday, and Friday at 8 PM.     doxylamine, Sleep, (UNISOM) 25 MG tablet Take 12.5 mg by mouth at bedtime as needed for sleep.     Evolocumab  (REPATHA  SURECLICK) 140 MG/ML SOAJ INJECT 140 MG INTO THE SKIN EVERY 14 (FOURTEEN) DAYS. 6 mL 3   ezetimibe  (ZETIA ) 10 MG tablet TAKE 1 TABLET BY MOUTH EVERY DAY 90 tablet 3   Fexofenadine HCl (ALLEGRA PO) Take by mouth daily.     naproxen sodium (ALEVE) 220 MG tablet Take 220 mg by mouth daily as needed (pain).     Sod Picosulfate-Mag Ox-Cit Acd (CLENPIQ ) 10-3.5-12 MG-GM -GM/175ML SOLN Take 1 kit by mouth as directed. 350 mL 0   No current facility-administered medications on file prior to visit.    Past Surgical History:  Procedure Laterality Date   COLONOSCOPY WITH PROPOFOL  N/A 11/20/2022   Procedure: COLONOSCOPY WITH PROPOFOL ;  Surgeon: Shaaron Lamar HERO, MD;  [facility];  Service: Endoscopy;  Laterality: N/A;  9:45 AM, ASA  2   POLYPECTOMY  11/20/2022   Procedure: POLYPECTOMY INTESTINAL;  Surgeon: Shaaron Lamar HERO, MD;  [facility];  Service: Endoscopy;;    Allergies  Allergen Reactions   Atorvastatin  Other (See Comments)    myalgias   Crestor  [Rosuvastatin ] Other (See Comments)    myalgias    BP 126/82   Ht 5' 8 (1.727 m)   Wt 205 lb (93 kg)   LMP 01/21/2017 (Approximate)   BMI 31.17 kg/m       No data to display              No data to display              Objective:  Physical Exam:  Gen: NAD, comfortable in exam room MSK: Right Knee Inspection: Band Aid on right knee, mild residual swelling compared to the left knee  Palpation: There is tenderness to palpation along the lateral aspect of the knee, no tenderness to palpation of the patella, patella tendon, quadriceps tendon, tibia, or joint lines ROM: Full extension - did not test flexion Strength: Not assessed  Neuro/Vasc: neurovascularly intact distally  Special Tests: None  MSK: Left Elbow/Forearm  Inspection: no appreciable deformities or bony prominences  Palpation: There is tenderness to palpation at the lateral epicondyle and insertion point of the common extensor tendon, all mild ROM: FROM  Strength: 5/5 with extension and flexion, mild pain/tightness with resisted extension of wrist Neuro/Vasc: neurovascularly intact distally  Special Tests: Maudsley's test positive   (02/26/2024) DG Knee AP/LAT w/Sunrise Right: Subtle patellar fracture. No additional fracture or dislocation.   Ultrasound Left Elbow: Revealed a partial tear of the left common extensor tendon.  No cortical irregularity of proximal radius.  No effusion.   Assessment & Plan:  1. Vertical Incomplete Patellar Fracture - Patient reports improvement in symptoms with the use of an immobilizer brace for the past 2 weeks and Ibuprofen as needed. X-rays from 02/26/2024 reassuring, no signs of further fracture or dislocation. Discussed with patient the  recommended duration of time in the immobilizer brace is a total of 6 weeks. Advised patient to follow-up in 4 weeks and that we will repeat x-rays at this time.   - Continue knee immobilizer for 4 weeks (6 weeks total)  - NSAIDs and ice as needed   - Repeat x-ray in 4 weeks  - Follow-up in 4 weeks   2. Acute pain of left elbow - Suspect patient is experiencing pain/discomfort in her left elbow due to strain, tendonitis. Ultrasound today revealed a slight tear of the left common extensor tendon, which could be contributing to her pain/discomfort with resisted wrist and finger extension. Will treat conservatively for the time being.   - NSAIDs as needed   - Avoid activities that exacerbate pain as able

## 2024-03-25 ENCOUNTER — Ambulatory Visit
Admission: RE | Admit: 2024-03-25 | Discharge: 2024-03-25 | Disposition: A | Discharge status: Home / Self Care | Source: Ambulatory Visit | Attending: Family Medicine | Admitting: Family Medicine

## 2024-03-25 DIAGNOSIS — M25561 Pain in right knee: Secondary | ICD-10-CM

## 2024-03-25 DIAGNOSIS — S82001D Unspecified fracture of right patella, subsequent encounter for closed fracture with routine healing: Secondary | ICD-10-CM | POA: Diagnosis not present

## 2024-03-26 ENCOUNTER — Encounter: Payer: Self-pay | Admitting: Family Medicine

## 2024-03-26 ENCOUNTER — Ambulatory Visit (INDEPENDENT_AMBULATORY_CARE_PROVIDER_SITE_OTHER): Admitting: Family Medicine

## 2024-03-26 VITALS — BP 122/80 | Ht 68.0 in | Wt 205.0 lb

## 2024-03-26 DIAGNOSIS — S82024D Nondisplaced longitudinal fracture of right patella, subsequent encounter for closed fracture with routine healing: Secondary | ICD-10-CM | POA: Diagnosis not present

## 2024-03-26 NOTE — Progress Notes (Cosign Needed)
 PCP: [name], MD  Subjective:   HPI: Patient is a 58 y.o. female here for follow-up of vertical patella fracture.  She was last evaluated at [facility] on 8/6.  The initial injury occurred 7/20 after a fall while walking.  It was recommended that she remain in a knee immobilizer for a total of 6 weeks at her last visit.  Repeat x-rays and follow-up in 4 weeks was arranged.  Today she endorses improvement in pain of the right knee.  She remains in a knee immobilizer, but feels that her range of motion is improving when she attempts to move her right knee when out of the immobilizer.  She has no additional concerns to discuss.  She continues to endorse mild discomfort in the left lateral elbow.  Past Medical History:  Diagnosis Date   Headache(784.0)     Current Outpatient Medications on File Prior to Visit  Medication Sig Dispense Refill   acetaminophen (TYLENOL) 500 MG tablet Take 1,000 mg by mouth every 6 (six) hours as needed (pain.).     Coenzyme Q10 (COQ-10 PO) Take 1 tablet by mouth every Monday, Wednesday, and Friday at 8 PM.     doxylamine, Sleep, (UNISOM) 25 MG tablet Take 12.5 mg by mouth at bedtime as needed for sleep.     Evolocumab  (REPATHA  SURECLICK) 140 MG/ML SOAJ INJECT 140 MG INTO THE SKIN EVERY 14 (FOURTEEN) DAYS. 6 mL 3   ezetimibe  (ZETIA ) 10 MG tablet TAKE 1 TABLET BY MOUTH EVERY DAY 90 tablet 3   Fexofenadine HCl (ALLEGRA PO) Take by mouth daily.     naproxen sodium (ALEVE) 220 MG tablet Take 220 mg by mouth daily as needed (pain).     Sod Picosulfate-Mag Ox-Cit Acd (CLENPIQ ) 10-3.5-12 MG-GM -GM/175ML SOLN Take 1 kit by mouth as directed. 350 mL 0   No current facility-administered medications on file prior to visit.    Past Surgical History:  Procedure Laterality Date   COLONOSCOPY WITH PROPOFOL  N/A 11/20/2022   Procedure: COLONOSCOPY WITH PROPOFOL ;  Surgeon: Shaaron Lamar HERO, MD;  [facility];  Service: Endoscopy;  Laterality: N/A;  9:45 AM, ASA 2    POLYPECTOMY  11/20/2022   Procedure: POLYPECTOMY INTESTINAL;  Surgeon: Shaaron Lamar HERO, MD;  [facility];  Service: Endoscopy;;    Allergies  Allergen Reactions   Atorvastatin  Other (See Comments)    myalgias   Crestor  [Rosuvastatin ] Other (See Comments)    myalgias    BP (!) 154/100   Ht 5' 8 (1.727 m)   Wt 205 lb (93 kg)   LMP 01/21/2017 (Approximate)   BMI 31.17 kg/m       No data to display              No data to display              Objective:  Physical Exam:  Gen: NAD, comfortable in exam room  Right knee Mild residual prepatellar swelling and ecchymoses noted No TTP ROM from 0-90 degrees of flexion.  She endorses tightness with flexion beyond 90 degrees. NV intact distally.  X-rays - Right Knee-AP/LAT/sunrise (03/25/24) Subtle vertical patellar fracture with callus formation.  No additional acute findings    Assessment & Plan:  1.  Longitudinal patellar fracture Following up today for longitudinal patellar fracture.  Updated x-rays show routine healing.  Range of motion and pain are improving.  She can graduate from the knee immobilizer today.  Recommend knee flexion/extension range of  motion exercises at home.  Would avoid weighted-exercises for at least the next week.  She can ambulate as tolerated and gradually increase activity as pain allows.  At this point she can return to care for follow-up on an as-needed basis.

## 2024-03-27 ENCOUNTER — Encounter: Payer: Self-pay | Admitting: Family Medicine

## 2024-07-30 ENCOUNTER — Encounter: Payer: Self-pay | Admitting: Internal Medicine

## 2024-07-31 ENCOUNTER — Other Ambulatory Visit: Payer: Self-pay | Admitting: Internal Medicine
